# Patient Record
Sex: Male | Born: 1965 | Race: White | Hispanic: No | Marital: Single | Smoking: Never smoker
Health system: Southern US, Academic
[De-identification: ages and names within clinical notes are randomized; demographics above are authoritative.]

## PROBLEM LIST (undated history)

## (undated) ENCOUNTER — Inpatient Hospital Stay (HOSPITAL_COMMUNITY): Admission: EM | Payer: MEDICAID | Source: Other Acute Inpatient Hospital

## (undated) DIAGNOSIS — I10 Essential (primary) hypertension: Secondary | ICD-10-CM

## (undated) DIAGNOSIS — F1010 Alcohol abuse, uncomplicated: Secondary | ICD-10-CM

## (undated) DIAGNOSIS — H9190 Unspecified hearing loss, unspecified ear: Secondary | ICD-10-CM

## (undated) DIAGNOSIS — H9319 Tinnitus, unspecified ear: Secondary | ICD-10-CM

## (undated) HISTORY — DX: Unspecified hearing loss, unspecified ear: H91.90

## (undated) HISTORY — DX: Tinnitus, unspecified ear: H93.19

---

## 2018-06-30 ENCOUNTER — Encounter (HOSPITAL_COMMUNITY): Payer: Self-pay | Admitting: Emergency Medicine

## 2018-06-30 ENCOUNTER — Emergency Department (HOSPITAL_COMMUNITY): Payer: Self-pay

## 2018-06-30 ENCOUNTER — Emergency Department (HOSPITAL_COMMUNITY)
Admission: EM | Admit: 2018-06-30 | Discharge: 2018-06-30 | Disposition: A | Discharge status: Home / Self Care | Payer: Self-pay | Attending: Emergency Medicine | Admitting: Emergency Medicine

## 2018-06-30 ENCOUNTER — Other Ambulatory Visit: Payer: Self-pay

## 2018-06-30 DIAGNOSIS — S6992XA Unspecified injury of left wrist, hand and finger(s), initial encounter: Secondary | ICD-10-CM | POA: Insufficient documentation

## 2018-06-30 DIAGNOSIS — M25511 Pain in right shoulder: Secondary | ICD-10-CM | POA: Insufficient documentation

## 2018-06-30 DIAGNOSIS — Y9289 Other specified places as the place of occurrence of the external cause: Secondary | ICD-10-CM | POA: Insufficient documentation

## 2018-06-30 DIAGNOSIS — Y9389 Activity, other specified: Secondary | ICD-10-CM | POA: Insufficient documentation

## 2018-06-30 DIAGNOSIS — G8929 Other chronic pain: Secondary | ICD-10-CM | POA: Insufficient documentation

## 2018-06-30 DIAGNOSIS — W208XXA Other cause of strike by thrown, projected or falling object, initial encounter: Secondary | ICD-10-CM | POA: Insufficient documentation

## 2018-06-30 DIAGNOSIS — Y990 Civilian activity done for income or pay: Secondary | ICD-10-CM | POA: Insufficient documentation

## 2018-06-30 DIAGNOSIS — I10 Essential (primary) hypertension: Secondary | ICD-10-CM | POA: Insufficient documentation

## 2018-06-30 HISTORY — DX: Essential (primary) hypertension: I10

## 2018-06-30 MED ORDER — IBUPROFEN 200 MG PO TABS
600.0000 mg | ORAL_TABLET | Freq: Once | ORAL | Status: AC
  Administered 2018-06-30: 600 mg via ORAL
  Filled 2018-06-30: qty 3

## 2018-06-30 MED ORDER — IBUPROFEN 600 MG PO TABS: 600.0000 mg | ORAL_TABLET | Freq: Four times a day (QID) | ORAL | 0 refills | Status: AC | PRN

## 2018-06-30 NOTE — ED Notes (Signed)
Patient transported to X-ray 

## 2018-06-30 NOTE — ED Provider Notes (Signed)
[facility]-EMERGENCY DEPT Provider Note   CSN: [id number] Arrival date & time: 06/30/18  0532     History   Chief Complaint Chief Complaint  Patient presents with  . Wrist Pain    left  . Shoulder Pain    right    HPI Christopher Clayton is a 52 y.o. male w PMHx HTN, presenting to the ED with 2 days of acute onset of left wrist pain. 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 The box hit his left wrist and he has had diffuse pain to the left wrist, worse with movement. Reports assoc tingling sensation to fingers and swelling in hand, however denies numbness. No medications taken for pain. Pt also complaining of chronic right shoulder pain that is worse with lifting his arm. Reports remote injury after a fall at home and has had pain since. No medications taken for pain. Has not seen specialist for shoulder. No recent injury.   The history is provided by the patient.    Past Medical History:  Diagnosis Date  . Hypertension     There are no active problems to display for this patient.   History reviewed. No pertinent surgical history.      Home Medications    Prior to Admission medications   Not on File    Family History No family history on file.  Social History Social History   Tobacco Use  . Smoking status: Never Smoker  . Smokeless tobacco: Current User    Types: Chew  Substance Use Topics  . Alcohol use: Not Currently  . Drug use: Not on file     Allergies   Buspar [buspirone] and Dilantin [phenytoin sodium extended]   Review of Systems Review of Systems  Musculoskeletal: Positive for arthralgias and joint swelling.  Skin: Negative for color change and wound.  Neurological: Negative for numbness.     Physical Exam Updated Vital Signs BP 108/74   Pulse 79   Temp 97.7 F (36.5 C)   Resp 17   Ht 6' (1.829 m)   Wt 88.5 kg   SpO2 99%   BMI 26.45 kg/m   Physical Exam  Constitutional: He appears  well-developed and well-nourished. No distress.  HENT:  Head: Normocephalic and atraumatic.  Eyes: Conjunctivae are normal.  Cardiovascular: Normal rate.  Pulmonary/Chest: Effort normal.  Musculoskeletal:  Left wrist with generalized tenderness. No obvious swelling compared to right. No deformity, wounds, or ecchymosis. Pain with passive inversion. Normal sensation. Negative tinel's sign.  Right shoulder w TTP to anterior aspect. Pain with RROM with internal rotation. Pain with passive abduction. No deformity. Normal distal sensation.  Psychiatric: He has a normal mood and affect. His behavior is normal.  Nursing note and vitals reviewed.    ED Treatments / Results  Labs (all labs ordered are listed, but only abnormal results are displayed) Labs Reviewed - No data to display  EKG None  Radiology Dg Wrist Complete Left  Result Date: 06/30/2018 CLINICAL DATA:  New onset left wrist pain and tingling for 2 days, atraumatic EXAM: LEFT WRIST - COMPLETE 3+ VIEW COMPARISON:  None. FINDINGS: There is no evidence of fracture or dislocation. Degenerative spurring and MCP joints, especially 1 through 3. Diffuse arterial calcification. IMPRESSION: No acute finding. Electronically Signed   By: Marnee Spring M.D.   On: 06/30/2018 07:08    Procedures Procedures (including critical care time)  Medications Ordered in ED Medications - No data to display  Initial Impression / Assessment and Plan / ED Course  I have reviewed the triage vital signs and the nursing notes.  Pertinent labs & imaging results that were available during my care of the patient were reviewed by me and considered in my medical decision making (see chart for details).    Patient presenting with left wrist pain after a box fell on it 2 days ago.  Pain with range of motion and tingling sensation in the digits.  Negative Tinel sign.  X-ray showing degenerative changes though no acute injury.  Patient also with chronic right  shoulder pain after remote injury, suspect possible old rotator cuff injury.  Will provide sling for comfort.  Wrist splint.  Orthopedic referral for follow-up.  Discussed RICE therapy and NSAIDs.  Safe for discharge.  Discussed results, findings, treatment and follow up. Patient advised of return precautions. Patient verbalized understanding and agreed with plan.  Final Clinical Impressions(s) / ED Diagnoses   Final diagnoses:  Left wrist injury, initial encounter  Chronic right shoulder pain    ED Discharge Orders    None       Seanpaul Preece, [place] N, PA-C 06/30/18 1610    Nira Conn, MD 06/30/18 [phone]

## 2018-06-30 NOTE — Discharge Instructions (Signed)
Please read instructions below. Apply ice to your areas of pain for 20 minutes at a time. You can take ibuprofen every 6 hours as needed for pain and swelling. Schedule an appointment with the orthopedic specialist for further evaluation of your ongoing shoulder pain. Return to the ER for new or concerning symptoms.

## 2018-07-16 ENCOUNTER — Emergency Department (HOSPITAL_BASED_OUTPATIENT_CLINIC_OR_DEPARTMENT_OTHER)
Admission: EM | Admit: 2018-07-16 | Discharge: 2018-07-17 | Disposition: A | Discharge status: Home / Self Care | Payer: Self-pay | Attending: Emergency Medicine | Admitting: Emergency Medicine

## 2018-07-16 ENCOUNTER — Other Ambulatory Visit: Payer: Self-pay

## 2018-07-16 ENCOUNTER — Encounter (HOSPITAL_BASED_OUTPATIENT_CLINIC_OR_DEPARTMENT_OTHER): Payer: Self-pay

## 2018-07-16 DIAGNOSIS — F17220 Nicotine dependence, chewing tobacco, uncomplicated: Secondary | ICD-10-CM | POA: Insufficient documentation

## 2018-07-16 DIAGNOSIS — F1010 Alcohol abuse, uncomplicated: Secondary | ICD-10-CM

## 2018-07-16 DIAGNOSIS — Y900 Blood alcohol level of less than 20 mg/100 ml: Secondary | ICD-10-CM | POA: Insufficient documentation

## 2018-07-16 DIAGNOSIS — F1094 Alcohol use, unspecified with alcohol-induced mood disorder: Secondary | ICD-10-CM | POA: Insufficient documentation

## 2018-07-16 DIAGNOSIS — Z79899 Other long term (current) drug therapy: Secondary | ICD-10-CM | POA: Insufficient documentation

## 2018-07-16 DIAGNOSIS — R45851 Suicidal ideations: Secondary | ICD-10-CM | POA: Insufficient documentation

## 2018-07-16 HISTORY — DX: Alcohol abuse, uncomplicated: F10.10

## 2018-07-16 LAB — COMPREHENSIVE METABOLIC PANEL
ALT: 17 U/L (ref 0–44)
ANION GAP: 13 (ref 5–15)
AST: 16 U/L (ref 15–41)
Albumin: 4.2 g/dL (ref 3.5–5.0)
Alkaline Phosphatase: 53 U/L (ref 38–126)
BUN: 11 mg/dL (ref 6–20)
CHLORIDE: 94 mmol/L — AB (ref 98–111)
CO2: 25 mmol/L (ref 22–32)
CREATININE: 0.81 mg/dL (ref 0.61–1.24)
Calcium: 8.9 mg/dL (ref 8.9–10.3)
Glucose, Bld: 98 mg/dL (ref 70–99)
POTASSIUM: 3.8 mmol/L (ref 3.5–5.1)
SODIUM: 132 mmol/L — AB (ref 135–145)
Total Bilirubin: 0.5 mg/dL (ref 0.3–1.2)
Total Protein: 7.4 g/dL (ref 6.5–8.1)

## 2018-07-16 LAB — CBC WITH DIFFERENTIAL/PLATELET
Abs Immature Granulocytes: 0.04 10*3/uL (ref 0.00–0.07)
BASOS ABS: 0.1 10*3/uL (ref 0.0–0.1)
Basophils Relative: 1 %
EOS PCT: 4 %
Eosinophils Absolute: 0.4 10*3/uL (ref 0.0–0.5)
HEMATOCRIT: 37.7 % — AB (ref 39.0–52.0)
Hemoglobin: 12.6 g/dL — ABNORMAL LOW (ref 13.0–17.0)
IMMATURE GRANULOCYTES: 0 %
LYMPHS ABS: 2.8 10*3/uL (ref 0.7–4.0)
LYMPHS PCT: 28 %
MCH: 30.1 pg (ref 26.0–34.0)
MCHC: 33.4 g/dL (ref 30.0–36.0)
MCV: 90 fL (ref 80.0–100.0)
MONOS PCT: 6 %
Monocytes Absolute: 0.6 10*3/uL (ref 0.1–1.0)
NRBC: 0 % (ref 0.0–0.2)
Neutro Abs: 6.1 10*3/uL (ref 1.7–7.7)
Neutrophils Relative %: 61 %
Platelets: 202 10*3/uL (ref 150–400)
RBC: 4.19 MIL/uL — ABNORMAL LOW (ref 4.22–5.81)
RDW: 13 % (ref 11.5–15.5)
WBC: 10 10*3/uL (ref 4.0–10.5)

## 2018-07-16 LAB — RAPID URINE DRUG SCREEN, HOSP PERFORMED
AMPHETAMINES: NOT DETECTED
BENZODIAZEPINES: NOT DETECTED
Barbiturates: NOT DETECTED
COCAINE: NOT DETECTED
OPIATES: NOT DETECTED
TETRAHYDROCANNABINOL: NOT DETECTED

## 2018-07-16 LAB — ACETAMINOPHEN LEVEL

## 2018-07-16 LAB — SALICYLATE LEVEL: Salicylate Lvl: <7 mg/dL (ref 2.8–30.0)

## 2018-07-16 LAB — ETHANOL: ALCOHOL ETHYL (B): 173 mg/dL — AB (ref <10)

## 2018-07-16 MED ORDER — FLUTICASONE PROPIONATE 50 MCG/ACT NA SUSP
2.0000 | Freq: Every day | NASAL | Status: DC
  Administered 2018-07-17: 2 via NASAL
  Filled 2018-07-16: qty 16

## 2018-07-16 MED ORDER — THIAMINE HCL 100 MG/ML IJ SOLN: 100.0000 mg | Freq: Every day | INTRAMUSCULAR | Status: DC

## 2018-07-16 MED ORDER — LORAZEPAM 1 MG PO TABS: 0.0000 mg | ORAL_TABLET | Freq: Two times a day (BID) | ORAL | Status: DC

## 2018-07-16 MED ORDER — LORAZEPAM 2 MG/ML IJ SOLN: 0.0000 mg | Freq: Four times a day (QID) | INTRAMUSCULAR | Status: DC

## 2018-07-16 MED ORDER — LORAZEPAM 2 MG/ML IJ SOLN: 0.0000 mg | Freq: Two times a day (BID) | INTRAMUSCULAR | Status: DC

## 2018-07-16 MED ORDER — RIFAXIMIN 200 MG PO TABS
600.0000 mg | ORAL_TABLET | Freq: Two times a day (BID) | ORAL | Status: DC
  Administered 2018-07-17: 600 mg via ORAL
  Filled 2018-07-16: qty 3

## 2018-07-16 MED ORDER — TRAZODONE HCL 50 MG PO TABS: 200.0000 mg | ORAL_TABLET | Freq: Every day | ORAL | Status: DC

## 2018-07-16 MED ORDER — MELATONIN 5 MG PO TABS
10.0000 mg | ORAL_TABLET | Freq: Every day | ORAL | Status: DC
  Filled 2018-07-16: qty 2

## 2018-07-16 MED ORDER — LORAZEPAM 1 MG PO TABS: 0.0000 mg | ORAL_TABLET | Freq: Four times a day (QID) | ORAL | Status: DC

## 2018-07-16 MED ORDER — ATORVASTATIN CALCIUM 40 MG PO TABS
40.0000 mg | ORAL_TABLET | Freq: Every day | ORAL | Status: DC
  Administered 2018-07-17: 40 mg via ORAL
  Filled 2018-07-16: qty 1

## 2018-07-16 MED ORDER — LORATADINE 10 MG PO TABS
10.0000 mg | ORAL_TABLET | Freq: Every day | ORAL | Status: DC
  Administered 2018-07-17: 10 mg via ORAL
  Filled 2018-07-16: qty 1

## 2018-07-16 MED ORDER — DIVALPROEX SODIUM 500 MG PO DR TAB
500.0000 mg | DELAYED_RELEASE_TABLET | Freq: Two times a day (BID) | ORAL | Status: DC
  Administered 2018-07-17: 500 mg via ORAL
  Filled 2018-07-16: qty 1

## 2018-07-16 MED ORDER — VITAMIN B-1 100 MG PO TABS
100.0000 mg | ORAL_TABLET | Freq: Every day | ORAL | Status: DC
  Administered 2018-07-17: 100 mg via ORAL
  Filled 2018-07-16: qty 1

## 2018-07-16 NOTE — ED Triage Notes (Addendum)
Pt states he is here for SI and ETOH abuse-last ETOH "10 beers 30 miniutes ago"-states he was brought in "by the police"-states he is homeless -NAD-steady gait

## 2018-07-16 NOTE — ED Provider Notes (Signed)
[facility] EMERGENCY DEPARTMENT Provider Note   CSN: [id number] Arrival date & time: 07/16/18  1559     History   Chief Complaint Chief Complaint  Patient presents with  . Suicidal  . Alcohol Problem    HPI Christopher Clayton is a 52 y.o. male with a history of alcohol use disorder and hypertension presents to the emergency department with a chief complaint of suicidal ideation.  The patient reports he drank a 10 pack of 12 ounce beers earlier today.  Last drink at 1600.  He states that he planned to kill himself by drinking too much alcohol.  He reports he has been in previous alcohol treatment centers, but feels he would best benefit from being placed in a 2-year alcohol treatment facility.  He denies HI, auditory, or visual hallucinations.   He reports that prior to arrival he was out in the woods drinking alcohol when he became concerned that he saw a wild animal so he started walking and states that he went to a dental clinic down the street who insisted on getting him some help and provided him with resources.  The patient has no other complaints at this time including nausea, vomiting, seizure-like activity, headache, tremor, chest pain, dyspnea, weakness, numbness, or confusion.   He reports a history of previous complicated withdrawal from alcohol.  He reports a history of seizures, but is unable to state if they are from alcohol or from other etiologies.  He also states that he thinks that he might of had DTs previously.  [place] and recently came to the area for rehab for alcohol.  He reports he is previously gone for 2 years without drinking without complications.  He states that he does not have any chronic medical conditions, but takes several medications, but he is unsure what they are.  He denies IV recreational drug use and is a never smoker.   The history is provided by the patient. No language interpreter was used.    Past Medical History:    Diagnosis Date  . ETOH abuse   . Hypertension     There are no active problems to display for this patient.   History reviewed. No pertinent surgical history.      Home Medications    Prior to Admission medications   Medication Sig Start Date End Date Taking? Authorizing Provider  atorvastatin (LIPITOR) 40 MG tablet Take 40 mg by mouth daily.   Yes [provider]  cetirizine (ZYRTEC) 10 MG tablet Take 10 mg by mouth daily.   Yes [provider]  divalproex (DEPAKOTE) 500 MG DR tablet Take 500 mg by mouth 2 (two) times daily.   Yes [provider]  fluticasone (FLONASE) 50 MCG/ACT nasal spray Place 2 sprays into both nostrils daily.   Yes [provider]  hydrOXYzine (ATARAX/VISTARIL) 25 MG tablet Take 25 mg by mouth 3 (three) times daily as needed for anxiety.   Yes [provider]  meclizine (ANTIVERT) 25 MG tablet Take 25 mg by mouth 3 (three) times daily as needed for dizziness.   Yes [provider]  MELATONIN PO Take 10 mg by mouth at bedtime.   Yes [provider]  rifaximin (XIFAXAN) 200 MG tablet Take 600 mg by mouth 2 (two) times daily. Take for 14 days; prescribed on 06/18/18   Yes [provider]  risperiDONE (RISPERDAL) 2 MG tablet Take 2 mg by mouth at bedtime.   Yes [provider]  traZODone (DESYREL) 100 MG tablet Take 200 mg by mouth at bedtime.   Yes [provider]  ibuprofen (ADVIL,MOTRIN) 600 MG tablet Take 1 tablet (600 mg total) by mouth every 6 (six) hours as needed. 06/30/18   Robinson, [place] N, PA-C    Family History History reviewed. No pertinent family history.  Social History Social History   Tobacco Use  . Smoking status: Never Smoker  . Smokeless tobacco: Current User    Types: Chew  Substance Use Topics  . Alcohol use: Yes    Comment: hx of 8-10 forties daily  . Drug use: Never     Allergies   Buspar [buspirone] and Dilantin [phenytoin sodium  extended]   Review of Systems Review of Systems  Constitutional: Negative for appetite change and fever.  Respiratory: Negative for shortness of breath.   Cardiovascular: Negative for chest pain.  Gastrointestinal: Negative for abdominal pain, nausea and vomiting.  Genitourinary: Negative for dysuria.  Musculoskeletal: Negative for back pain.  Skin: Negative for rash.  Allergic/Immunologic: Negative for immunocompromised state.  Neurological: Negative for tremors, seizures, weakness and headaches.  Psychiatric/Behavioral: Positive for suicidal ideas. Negative for confusion, hallucinations and self-injury. The patient is not nervous/anxious.      Physical Exam Updated Vital Signs BP 104/67 (BP Location: Right Arm)   Pulse 77   Temp 97.7 F (36.5 C) (Oral)   Resp 20   SpO2 99%   Physical Exam  Constitutional: He appears well-developed.  HENT:  Head: Normocephalic.  Eyes: Pupils are equal, round, and reactive to light. Conjunctivae and EOM are normal. No scleral icterus.  Neck: Neck supple.  Cardiovascular: Normal rate, regular rhythm, normal heart sounds and intact distal pulses. Exam reveals no gallop and no friction rub.  No murmur heard. Pulmonary/Chest: Effort normal. No stridor. No respiratory distress. He has no wheezes. He has no rales. He exhibits no tenderness.  Abdominal: Soft. He exhibits no distension and no mass. There is no tenderness. There is no rebound and no guarding. No hernia.  Musculoskeletal: He exhibits no tenderness.  No tremor noted.   Neurological: He is alert.  No slurred speech.  Speaks in complete, fluent sentences.  Skin: Skin is warm and dry.  Psychiatric: He has a normal mood and affect. His speech is normal and behavior is normal. He is not actively hallucinating. Thought content is not paranoid and not delusional. Cognition and memory are normal. He expresses suicidal ideation. He expresses no homicidal ideation. He expresses no homicidal  plans.  Nursing note and vitals reviewed.  ED Treatments / Results  Labs (all labs ordered are listed, but only abnormal results are displayed) Labs Reviewed  COMPREHENSIVE METABOLIC PANEL - Abnormal; Notable for the following components:      Result Value   Sodium 132 (*)    Chloride 94 (*)    All other components within normal limits  ETHANOL - Abnormal; Notable for the following components:   Alcohol, Ethyl (B) 173 (*)    All other components within normal limits  CBC WITH DIFFERENTIAL/PLATELET - Abnormal; Notable for the following components:   RBC 4.19 (*)    Hemoglobin 12.6 (*)    HCT 37.7 (*)    All other components within normal limits  ACETAMINOPHEN LEVEL - Abnormal; Notable for the following components:   Acetaminophen (Tylenol), Serum <10 (*)    All other components within normal limits  RAPID URINE DRUG SCREEN, HOSP PERFORMED  SALICYLATE LEVEL    EKG None  Radiology No  results found.  Procedures Procedures (including critical care time)  Medications Ordered in ED Medications  LORazepam (ATIVAN) injection 0-4 mg (has no administration in time range)    Or  LORazepam (ATIVAN) tablet 0-4 mg (has no administration in time range)  LORazepam (ATIVAN) injection 0-4 mg (has no administration in time range)    Or  LORazepam (ATIVAN) tablet 0-4 mg (has no administration in time range)  thiamine (VITAMIN B-1) tablet 100 mg (has no administration in time range)    Or  thiamine (B-1) injection 100 mg (has no administration in time range)  atorvastatin (LIPITOR) tablet 40 mg (has no administration in time range)  loratadine (CLARITIN) tablet 10 mg (has no administration in time range)  divalproex (DEPAKOTE) DR tablet 500 mg (has no administration in time range)  fluticasone (FLONASE) 50 MCG/ACT nasal spray 2 spray (has no administration in time range)  Melatonin TABS 10 mg (has no administration in time range)  traZODone (DESYREL) tablet 200 mg (has no administration  in time range)  rifaximin (XIFAXAN) tablet 600 mg (has no administration in time range)     Initial Impression / Assessment and Plan / ED Course  I have reviewed the triage vital signs and the nursing notes.  Pertinent labs & imaging results that were available during my care of the patient were reviewed by me and considered in my medical decision making (see chart for details).     52 year old male a history of alcohol use disorder and hypertension presenting with suicidal ideation.  He states that he wants to kill himself by drinking too much alcohol.  No HI or auditory visual hallucinations.  On exam, the patient appears clinically sober with no evidence of withdrawal or acute intoxication.  The patient was discussed with Dr. Fredderick Phenix, attending physician.  Labs are notable for ethanol level of 173, mild hyponatremia of 132, and chloride of 94.  I suspect metabolic abnormalities are secondary to chronic alcohol use.  CIWA protocol has been placed.  Spoke with [employer], counselor with TTS, who reports the daytime provider recommended overnight observation for the patient given questionable history of complicated withdrawal from EtOH.  No records available other than one ED visit on 06/30/2018 in the patient's chart.  Recommended speaking with the night provider after labs were available to see if disposition could be changed.  Spoke with Karleen Hampshire, nighttime [facility] health provider, who recommended overnight observation in a.m. psych evaluation. Pt medically cleared at this time. Psych hold orders and home med orders placed. TTS consult pending; please see psych team notes for further documentation of care/dispo. Pt stable at time of med clearance.    Final Clinical Impressions(s) / ED Diagnoses   Final diagnoses:  None    ED Discharge Orders    None       Arnice Vanepps A, PA-C 07/17/18 0048    Rolan Bucco, MD 07/19/18 1234

## 2018-07-16 NOTE — BH Assessment (Signed)
Tele Assessment Note   Patient Name: Christopher Clayton MRN: [id number] Referring Physician: Fredderick Phenix Location of Patient: [facility] Location of Provider: Behavioral Health TTS Department  Patient presented at [facility] stating that he was depressed and suicidal with a plan to overdose.  Patient just left [facility] against medical advice today and drank ten beers at discharge. Patient states that he did not want to stay in the [facility] because they were having him to talk about his past and things that were painful to him.   Patient is new to this area and was in a program, [facility], in [place] for six months, but left because he said they were going to kick him out because he was not paying his fees to stay there after he completed the 90 day program. Prior to that he states that he was in another long term program in the [place] area. Patient is currently homeless.  Patient states that he is depressed because he has little support, his parents are deceased and he states that he has not seen his daughter in since she was born.  Patient states that he thought about overdosing on his prescription medications. However, it is highly unlikely that he was discharged from any facility against medical advice with any prescriptions or prescription medications.  Attempted to find out more about his prior treatment history and asked patient if he had ever been on any other behavioral health units and he states that 1-2 months ago that he was [facility].  When asked if he had been on any other units, he stated, "I am sure I have been." Patient denies HI/Psychosis.  Patient states that he has been drinking since he was [age].  He states that prior to going to [facility] that he was drinking 8-10 forty ounce beers daily.  He denies any other drug use and denies any current withdrawal symptoms. Patient denies any current legal involvement.  Patient presented as alert and oriented.  His recent memory was good, but  remote was poor.  His thoughts were organized.  He had problems hearing and answering questions because he states that he hear a noise in his ears all the time.  He maintained good eye contact.  His judgment, insight and impulse control were impaired.  He presented and clean and neat, he did not appear to be depressed despite his claim to be depressed.  However, he stated that he is experiencing sleep disturbance and loss of appetite with weight loss.    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 ETOH abuse   . Hypertension     History reviewed. No pertinent surgical history.  Family History: History reviewed. No pertinent family history.  Social History:  reports that he has never smoked. His smokeless tobacco use includes chew. He reports that he drinks alcohol. He reports that he does not use drugs.  Additional Social History:  Alcohol / Drug Use Pain Medications: see MAR Prescriptions: see MAR Over the Counter: see MAR History of alcohol / drug use?: Yes Longest period of sobriety (when/how long): no significant period of abstinece noted Negative Consequences of Use: Personal relationships, Work / Programmer, multimedia, Surveyor, quantity Substance #1 Name of Substance 1: alcohol 1 - Age of First Use: 11 1 - Amount (size/oz): 8-10 forties 1 - Frequency: daily 1 - Duration: since onset 1 - Last Use / Amount: last use was today, 10 beers  CIWA: CIWA-Ar BP: 104/67 Pulse Rate: 77 COWS:    Allergies:  Allergies  Allergen Reactions  . Buspar [Buspirone]   . Dilantin [Phenytoin Sodium Extended]     Home Medications:  (Not in a hospital admission)  OB/GYN Status:  No LMP for male patient.  General Assessment Data Location of Assessment: [facility] TTS Assessment: In system Is this a Tele or Face-to-Face Assessment?: Tele Assessment Is this an Initial Assessment or a  Re-assessment for this encounter?: Initial Assessment Patient Accompanied by:: N/A Language Other than English: No Living Arrangements: Homeless/Shelter What gender do you identify as?: Male Marital status: Divorced Living Arrangements: Alone Can pt return to current living arrangement?: Yes Admission Status: Voluntary Is patient capable of signing voluntary admission?: Yes Referral Source: Self/Family/Friend Insurance type: (self-pay)     Crisis Care Plan Living Arrangements: Alone Legal Guardian: Other:(self) Name of Psychiatrist: (none) Name of Therapist: (none)  Education Status Is patient currently in school?: No Is the patient employed, unemployed or receiving disability?: Unemployed  Risk to self with the past 6 months Suicidal Ideation: Yes-Currently Present Has patient been a risk to self within the past 6 months prior to admission? : No Suicidal Intent: No Has patient had any suicidal intent within the past 6 months prior to admission? : No Is patient at risk for suicide?: Yes Suicidal Plan?: Yes-Currently Present Has patient had any suicidal plan within the past 6 months prior to admission? : Yes(overdose on Rx pills) Specify Current Suicidal Plan: (overdose) Access to Means: No(no prescriptions reported in chart) What has been your use of drugs/alcohol within the last 12 months?: daily use Previous Attempts/Gestures: No How many times?: (none reported, patient is guarded) Other Self Harm Risks: (homeless, unemployed and no support) Triggers for Past Attempts: None known Intentional Self Injurious Behavior: None Family Suicide History: Unable to assess Recent stressful life event(s): Other (Comment)(states that he has never seen his 54 year old daughter) Persecutory voices/beliefs?: No Depression: Yes Depression Symptoms: Insomnia, Isolating, Loss of interest in usual pleasures Substance abuse history and/or treatment for substance abuse?: Yes Suicide  prevention information given to non-admitted patients: Not applicable  Risk to Others within the past 6 months Homicidal Ideation: No Does patient have any lifetime risk of violence toward others beyond the six months prior to admission? : No Thoughts of Harm to Others: No Current Homicidal Intent: No Current Homicidal Plan: No Access to Homicidal Means: No Identified Victim: none History of harm to others?: No Assessment of Violence: None Noted Violent Behavior Description: none Does patient have access to weapons?: No Criminal Charges Pending?: No Does patient have a court date: No Is patient on probation?: No  Psychosis Hallucinations: None noted Delusions: None noted  Mental Status Report Appearance/Hygiene: Unremarkable Eye Contact: Good Motor Activity: Freedom of movement Speech: Unremarkable Level of Consciousness: Alert Mood: Depressed, Apathetic Affect: Appropriate to circumstance Anxiety Level: None Thought Processes: Coherent, Relevant Judgement: Impaired Orientation: Person, Place, Time, Situation Obsessive Compulsive Thoughts/Behaviors: None  Cognitive Functioning Concentration: Decreased Memory: Recent Intact, Remote Impaired Is patient IDD: No Insight: Poor Impulse Control: Poor Appetite: Poor Have you had any weight changes? : Loss Amount of the weight change? (lbs): (10) Sleep: Decreased Total Hours of Sleep: (states that he is not sleeping at all.) Vegetative Symptoms: None  ADLScreening [facility] Assessment Services) Patient's cognitive ability adequate to safely complete daily activities?: Yes Patient able to express need for assistance with ADLs?: Yes Independently performs ADLs?: Yes (appropriate for developmental age)  Prior Inpatient Therapy Prior Inpatient Therapy: Yes Prior Therapy Dates: Just left at [facility] today, Was at [facility] prior to that Prior Therapy Facilty/Provider(s): (multiple facilities in the past) Reason for Treatment:  (alcoholism)  Prior Outpatient Therapy Prior Outpatient Therapy: No Does patient have an ACCT team?: No Does patient have Intensive In-House Services?  : No Does patient have [facility] services? : No Does patient have P4CC services?: No  ADL Screening (condition at time of admission) Patient's cognitive ability adequate to safely complete daily activities?: Yes Is the patient deaf or have difficulty hearing?: No Does the patient have difficulty seeing, even when wearing glasses/contacts?: No Does the patient have difficulty concentrating, remembering, or making decisions?: No Patient able to express need for assistance with ADLs?: Yes Does the patient have difficulty dressing or bathing?: No Independently performs ADLs?: Yes (appropriate for developmental age) Does the patient have difficulty walking or climbing stairs?: No Weakness of Legs: None Weakness of Arms/Hands: None  Home Assistive Devices/Equipment Home Assistive Devices/Equipment: None  Therapy Consults (therapy consults require a physician order) PT Evaluation Needed: No OT Evalulation Needed: No SLP Evaluation Needed: No Abuse/Neglect Assessment (Assessment to be complete while patient is alone) Abuse/Neglect Assessment Can Be Completed: Yes Physical Abuse: Denies Verbal Abuse: Denies Sexual Abuse: Denies Exploitation of patient/patient's resources: Denies Self-Neglect: Denies Values / Beliefs Cultural Requests During Hospitalization: None Spiritual Requests During Hospitalization: None Consults Spiritual Care Consult Needed: No Social Work Consult Needed: No Merchant navy officer (For Healthcare) Does Patient Have a Medical Advance Directive?: No Nutrition Screen- [facility] Adult/WL/AP Has the patient recently lost weight without trying?: Yes, 2-13 lbs. Has the patient been eating poorly because of a decreased appetite?: Yes Malnutrition Screening Tool Score: 2        Disposition: Per Shuvon Rankin, NP, Patient  will need to be observed and monitored for safety and withdrawal potential tonight.  However, when patient is medically cleared, and labs returned, the Night-time Provider may need to review to see if another disposition is possible. Disposition Initial Assessment Completed for this Encounter: (Overnight OBS or evaluate when medically cleared) Patient referred to: (OBS)  This service was provided via telemedicine using a 2-way, interactive audio and Immunologist.  Names of all persons participating in this telemedicine service and their role in this encounter. Name:Maren Wiesen Role: TTS  Name: Finneus Kaneshiro Role: Patient  Name:  Role:   Name:  Role:     Daphene Calamity 07/16/2018 6:44 PM

## 2018-07-17 ENCOUNTER — Other Ambulatory Visit: Payer: Self-pay

## 2018-07-17 ENCOUNTER — Emergency Department (HOSPITAL_COMMUNITY)
Admission: EM | Admit: 2018-07-17 | Discharge: 2018-07-18 | Disposition: A | Discharge status: Home / Self Care | Payer: Self-pay | Attending: Emergency Medicine | Admitting: Emergency Medicine

## 2018-07-17 ENCOUNTER — Encounter (HOSPITAL_COMMUNITY): Payer: Self-pay

## 2018-07-17 ENCOUNTER — Encounter (HOSPITAL_BASED_OUTPATIENT_CLINIC_OR_DEPARTMENT_OTHER): Payer: Self-pay | Admitting: Registered Nurse

## 2018-07-17 DIAGNOSIS — F10920 Alcohol use, unspecified with intoxication, uncomplicated: Secondary | ICD-10-CM

## 2018-07-17 DIAGNOSIS — Y9389 Activity, other specified: Secondary | ICD-10-CM | POA: Insufficient documentation

## 2018-07-17 DIAGNOSIS — W1839XA Other fall on same level, initial encounter: Secondary | ICD-10-CM | POA: Insufficient documentation

## 2018-07-17 DIAGNOSIS — F10220 Alcohol dependence with intoxication, uncomplicated: Secondary | ICD-10-CM | POA: Insufficient documentation

## 2018-07-17 DIAGNOSIS — R45851 Suicidal ideations: Secondary | ICD-10-CM | POA: Insufficient documentation

## 2018-07-17 DIAGNOSIS — Y999 Unspecified external cause status: Secondary | ICD-10-CM | POA: Insufficient documentation

## 2018-07-17 DIAGNOSIS — Z79899 Other long term (current) drug therapy: Secondary | ICD-10-CM | POA: Insufficient documentation

## 2018-07-17 DIAGNOSIS — I10 Essential (primary) hypertension: Secondary | ICD-10-CM | POA: Insufficient documentation

## 2018-07-17 DIAGNOSIS — S0081XA Abrasion of other part of head, initial encounter: Secondary | ICD-10-CM | POA: Insufficient documentation

## 2018-07-17 DIAGNOSIS — Y92512 Supermarket, store or market as the place of occurrence of the external cause: Secondary | ICD-10-CM | POA: Insufficient documentation
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
## 2018-07-17 NOTE — ED Notes (Signed)
NAD at this time. Pt is stable and going home.  

## 2018-07-17 NOTE — Progress Notes (Signed)
Patient is seen by me via tele-psych and I have consulted with Dr. Lucianne MussKumar.  Patient denies any suicidal or homicidal ideations and denies any hallucinations.  Patient reports that he wants to get help with his alcohol abuse and reports that he is an alcoholic.  Patient walked out of [facility] yesterday after 16 days and stated that he left because of him being not as intelligent as they are and that he could not keep up with him.  He also reports that he was in a sober living house for 2 weeks prior to going to [facility].  Patient states that he wants an very long-term treatment, but then he refuses information for [facility], [facility], AA, NA, [facility], [facility] and states that he has a paper that was given to him by a lady that worked in the dentist office yesterday and that is where he wants to go.  However, patient could not tell me where this facility was.  Patient is informed that he does not meet inpatient criteria and he becomes upset when he is told that he will be discharged.  He he states to me that he will go out and buy more alcohol if he is discharged from the hospital.  Patient is informed and encouraged to follow his treatment coping skills and to contact crisis lines to help him remain from consuming alcohol.  Patient turns his head away from me and will not answer any further questions.  Patient ended up refusing every option that we offered him today.  I contacted Dr. Rubin PayorPickering and notified him of the recommendations.  Patient does not meet inpatient criteria and is psychiatrically cleared.
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
## 2018-07-17 NOTE — ED Notes (Signed)
Feliz Beamravis called, telepsych machine in room, ready for visit.

## 2018-07-17 NOTE — ED Notes (Signed)
Pt ambulatory to the bathroom w/o assistance and with steady gait 

## 2018-07-17 NOTE — ED Provider Notes (Signed)
  Physical Exam  BP 105/72 (BP Location: Left Arm)   Pulse 90   Temp 97.8 F (36.6 C) (Oral)   Resp 18   SpO2 98%   Physical Exam  ED Course/Procedures     Procedures  MDM  Seen by psychiatry again.  Cleared for discharge.  Psychiatry states that they offered many resources and he would want none of them.  Not suicidal.  States he was just drunk last night.  Discharge home.       Benjiman CorePickering, Tasheka Houseman, MD 07/17/18 [phone]

## 2018-07-17 NOTE — ED Notes (Signed)
This RN was assisting patient to bathroom when it was noticed that he had something in his yellow socks. I asked the patient politely what was in his sock and he stated it was his tobacco. I told him it was policy for me to put it with his belongings and that nothing could be left in the room with him. He asked if I needed to take it and I said yes and I offered him a nicotine patch. He handed over the chewing tobacco, but refused the nicotine patch. Security was called to wand the patient again to double check that nothing was still on his person.
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
## 2018-07-18 NOTE — ED Notes (Signed)
Patient refusing any care at this time. Explained to patient the importance of cleaning his abrasion for infection control and patient states "I'm not worried about it, don't touch me, have a nice night"

## 2018-07-18 NOTE — ED Notes (Signed)
Patient refused vital signs and became agitated when he was told that he was to be discharged. Patient informed that he could not stay in the room and sleep until he figured out where he was going to go. Patient began cursing and security was called. Patient escorted to lobby, did not sign discharge paperwork

## 2019-01-20 ENCOUNTER — Inpatient Hospital Stay (HOSPITAL_COMMUNITY)
Admission: EM | Admit: 2019-01-20 | Discharge: 2019-01-20 | Disposition: A | Discharge status: Home / Self Care | Payer: Self-pay | Source: Other Acute Inpatient Hospital

## 2019-01-20 ENCOUNTER — Emergency Department (HOSPITAL_COMMUNITY): Payer: Self-pay | Admitting: Emergency Medicine

## 2019-01-20 DIAGNOSIS — Y906 Blood alcohol level of 120-199 mg/100 ml: Secondary | ICD-10-CM

## 2019-01-20 DIAGNOSIS — F10129 Alcohol abuse with intoxication, unspecified: Secondary | ICD-10-CM

## 2019-01-20 DIAGNOSIS — R45851 Suicidal ideations: Secondary | ICD-10-CM

## 2019-01-20 DIAGNOSIS — F329 Major depressive disorder, single episode, unspecified: Secondary | ICD-10-CM

## 2019-02-04 ENCOUNTER — Inpatient Hospital Stay (HOSPITAL_COMMUNITY)
Admission: EM | Admit: 2019-02-04 | Discharge: 2019-02-04 | Disposition: A | Discharge status: Home / Self Care | Payer: Self-pay | Source: Other Acute Inpatient Hospital

## 2019-02-04 DIAGNOSIS — Y909 Presence of alcohol in blood, level not specified: Secondary | ICD-10-CM

## 2019-02-04 DIAGNOSIS — F10129 Alcohol abuse with intoxication, unspecified: Secondary | ICD-10-CM

## 2019-02-04 DIAGNOSIS — J189 Pneumonia, unspecified organism: Secondary | ICD-10-CM

## 2019-02-04 DIAGNOSIS — Z590 Homelessness: Secondary | ICD-10-CM

## 2019-02-17 ENCOUNTER — Inpatient Hospital Stay (HOSPITAL_COMMUNITY)
Admission: EM | Admit: 2019-02-17 | Discharge: 2019-02-17 | Disposition: A | Discharge status: Home / Self Care | Payer: Medicaid Other | Source: Other Acute Inpatient Hospital

## 2019-03-24 IMAGING — CR DG WRIST COMPLETE 3+V*L*
4 series · 4 of 4 positions shown · non-contrast
Comparison: None.

CLINICAL DATA: New onset left wrist pain and tingling for 2 days,
atraumatic

EXAM:
LEFT WRIST - COMPLETE 3+ VIEW

[x wrist pa left]
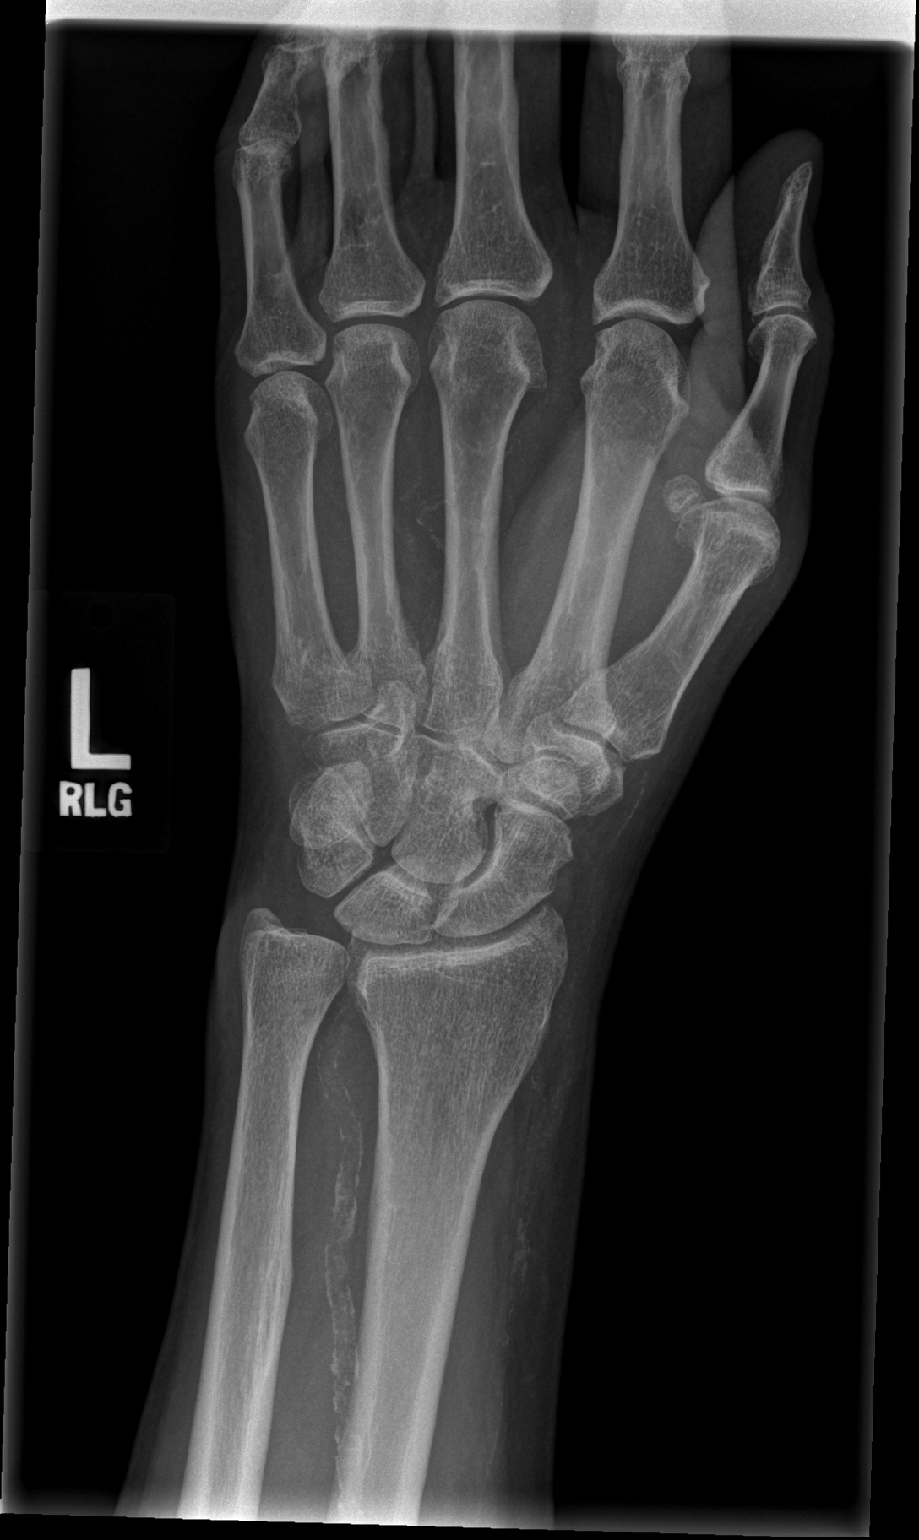

[x wrist obl left]
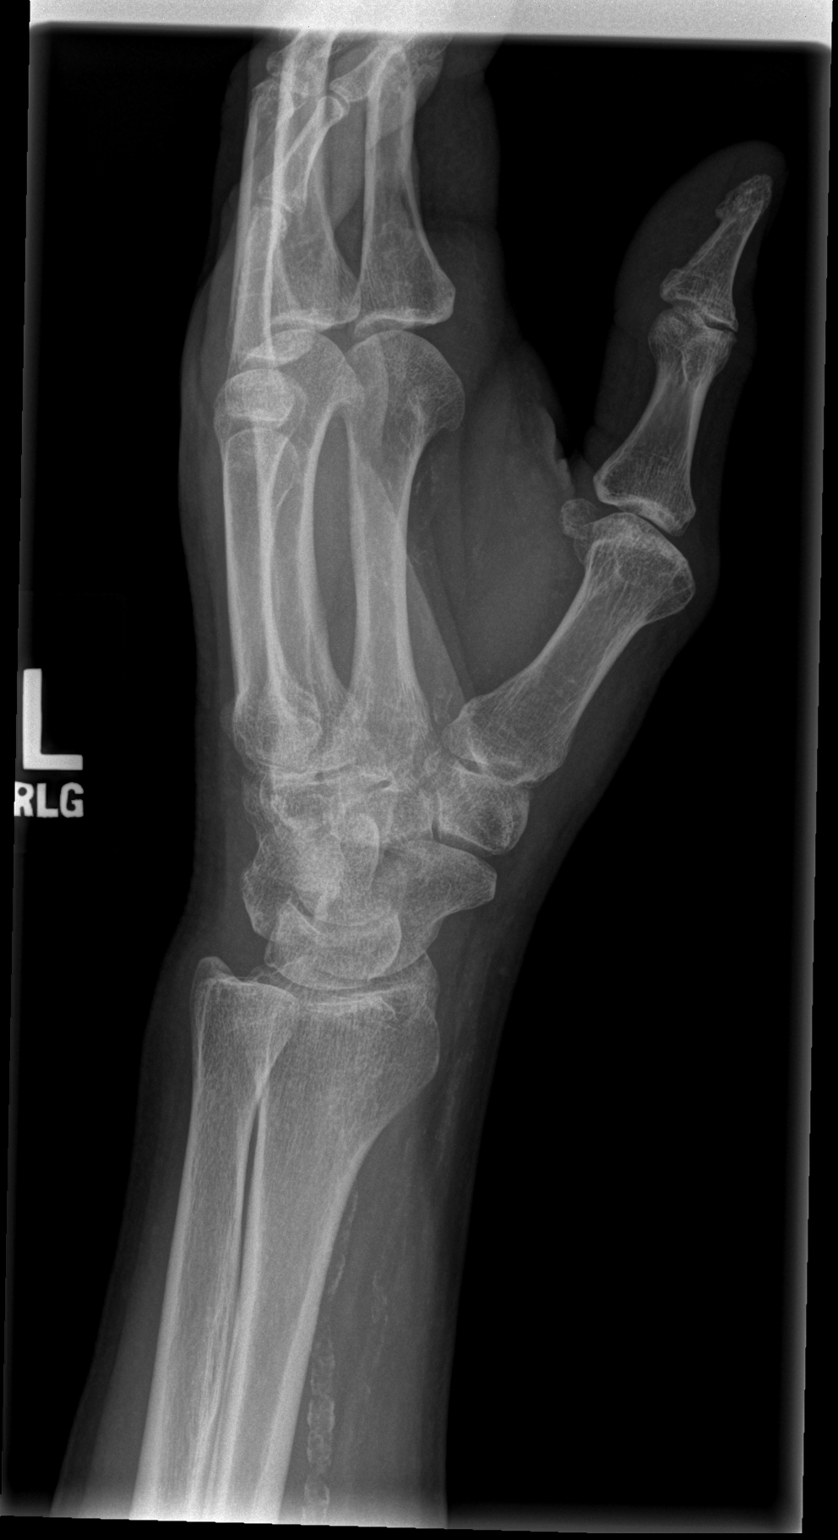

[x wrist lat left]
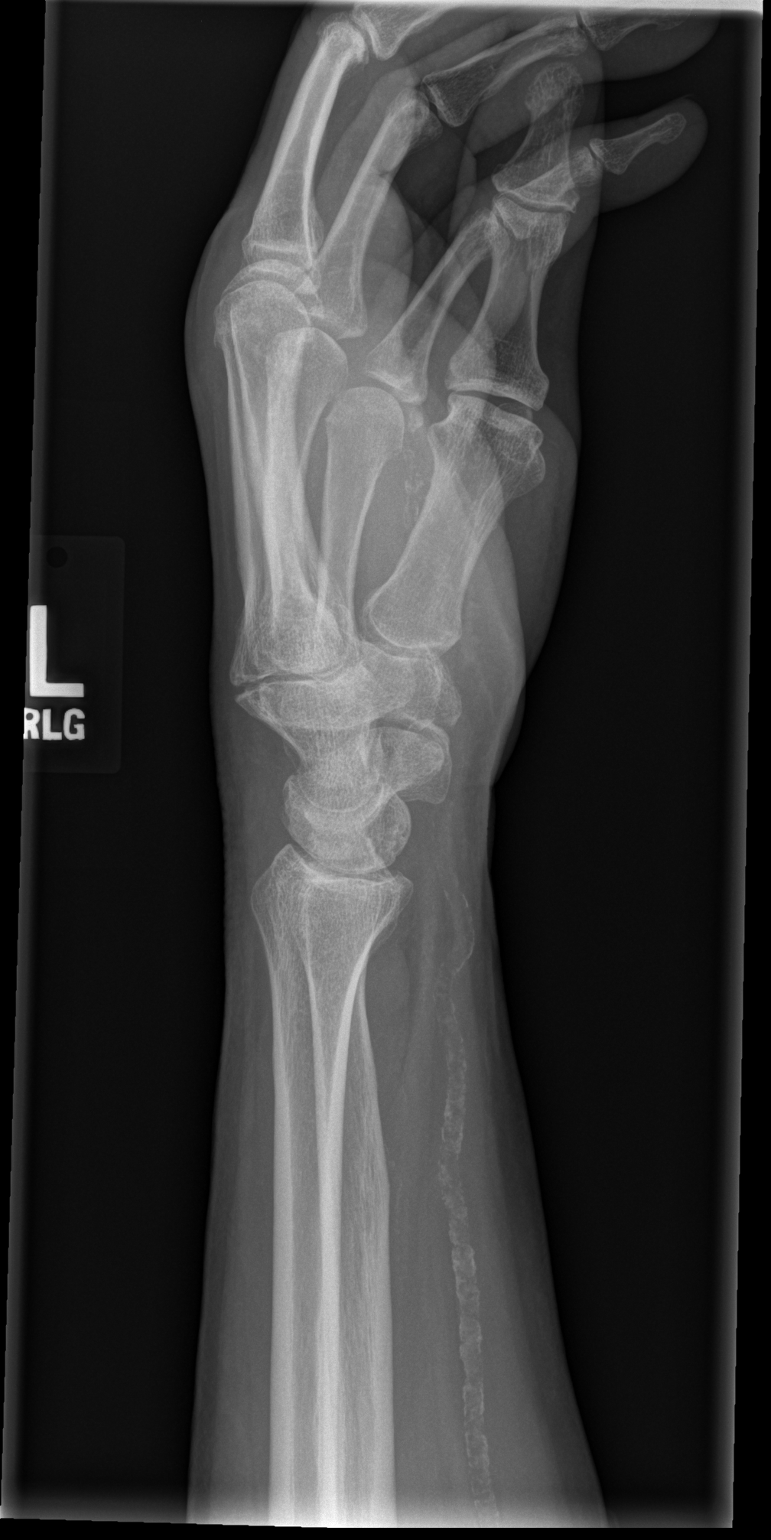

[x wrist navicular view left]
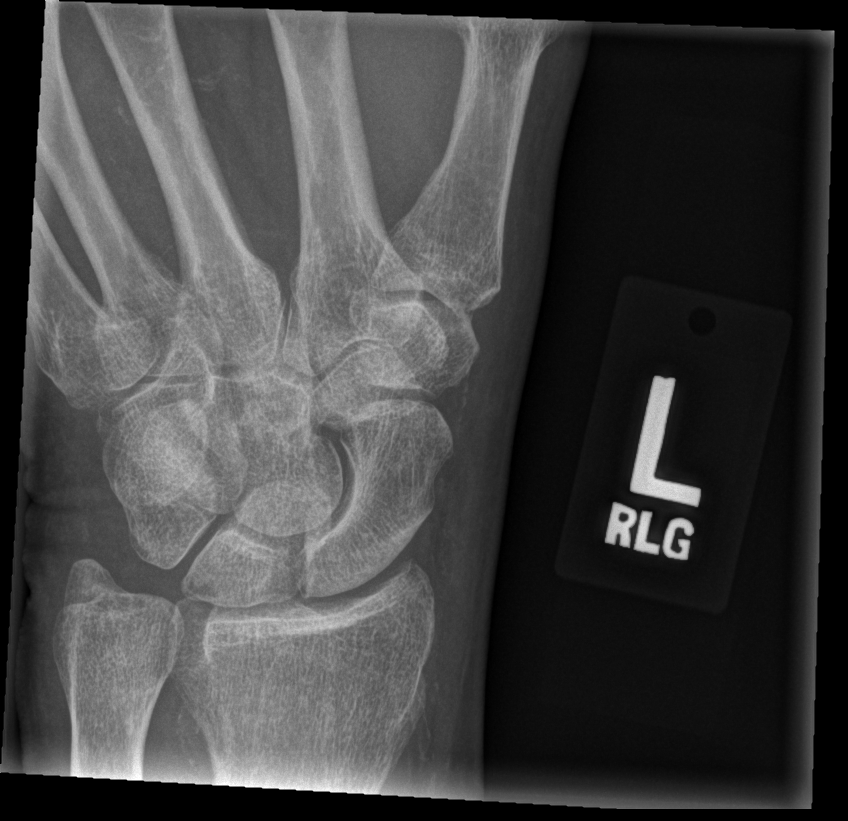

[4 of 4 positions shown; findings below may reference images not displayed]

FINDINGS: There is no evidence of fracture or dislocation. Degenerative
spurring and MCP joints, especially 1 through 3. Diffuse arterial
calcification.
IMPRESSION: No acute finding.

## 2019-04-27 ENCOUNTER — Ambulatory Visit (INDEPENDENT_AMBULATORY_CARE_PROVIDER_SITE_OTHER): Payer: Self-pay | Admitting: Otolaryngology

## 2019-05-07 ENCOUNTER — Ambulatory Visit (INDEPENDENT_AMBULATORY_CARE_PROVIDER_SITE_OTHER): Payer: Self-pay | Admitting: Otolaryngology

## 2019-05-11 ENCOUNTER — Ambulatory Visit (INDEPENDENT_AMBULATORY_CARE_PROVIDER_SITE_OTHER): Payer: Medicaid Other | Admitting: Otolaryngology

## 2019-05-11 ENCOUNTER — Encounter (INDEPENDENT_AMBULATORY_CARE_PROVIDER_SITE_OTHER): Payer: Self-pay | Admitting: Otolaryngology

## 2019-05-11 ENCOUNTER — Other Ambulatory Visit: Payer: Self-pay

## 2019-05-11 VITALS — BP 116/78 | HR 93 | Temp 98.8°F | Ht 73.0 in | Wt 226.1 lb

## 2019-05-11 DIAGNOSIS — H903 Sensorineural hearing loss, bilateral: Secondary | ICD-10-CM

## 2019-05-11 DIAGNOSIS — H6123 Impacted cerumen, bilateral: Secondary | ICD-10-CM

## 2019-05-11 DIAGNOSIS — H9319 Tinnitus, unspecified ear: Secondary | ICD-10-CM

## 2019-05-11 DIAGNOSIS — H9190 Unspecified hearing loss, unspecified ear: Secondary | ICD-10-CM

## 2019-05-11 DIAGNOSIS — F17200 Nicotine dependence, unspecified, uncomplicated: Secondary | ICD-10-CM

## 2019-05-11 DIAGNOSIS — H6980 Other specified disorders of Eustachian tube, unspecified ear: Secondary | ICD-10-CM

## 2019-05-11 DIAGNOSIS — H9313 Tinnitus, bilateral: Secondary | ICD-10-CM

## 2019-05-11 NOTE — Progress Notes (Signed)
[facility], [facility]  [address]  [place] [id number]  [phone]    PATIENT NAME:  Kevin Hodges  MRN:  [id number]  DOB:  04/18/1966  DATE OF SERVICE: 05/11/2019    Chief Complaint:  Ear Problem(s) (both ears swishing noise) and Dizziness      HPI:  Kevin Hodges is a 53 y.o. male presenting as a new patient for evaluation of tinnitus. Patient reports that he hears a swishing noise in his ears. He also states that he has trouble hearing when there is background noise. 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 He denies recent audiogram. He also reports slight vertigo. He denies significant hx of ear infections. He does note that his ears pop at times.    Past Medical History:  Past Medical History:   Diagnosis Date   . Hearing loss    . Tinnitus      Past Surgical History:  No past surgical history on file.    Family History:  Family Medical History:     Problem Relation (Age of Onset)    No Known Problems Mother, Father        Social History:  Social History     Tobacco Use   Smoking Status Never Smoker   Smokeless Tobacco Current User     Social History     Substance and Sexual Activity   Alcohol Use Yes   . Binge frequency: Monthly     Social History     Occupational History   . Not on file     Medications:  Outpatient Medications Marked as Taking for the 05/11/19 encounter (Office Visit) with Marcene Duosliverio, Evolette Pendell, MD   Medication Sig   . albuterol sulfate (PROVENTIL OR VENTOLIN OR PROAIR) 90 mcg/actuation Inhalation HFA Aerosol Inhaler    . divalproex (DEPAKOTE) 250 mg Oral Tablet, Delayed Release (E.C.)    . DULoxetine (CYMBALTA DR) 30 mg Oral Capsule, Delayed Release(E.C.)    . folic acid (FOLVITE) 1 mg Oral Tablet    . gabapentin (NEURONTIN) 300 mg Oral Capsule    . hydrOXYzine HCL (ATARAX) 50 mg Oral Tablet    . meclizine (ANTIVERT) 12.5 mg Oral Tablet    . omeprazole (PRILOSEC) 20 mg Oral Capsule, Delayed Release(E.C.)    .  ondansetron (ZOFRAN) 4 mg Oral Tablet    . pantoprazole (PROTONIX) 40 mg Oral Tablet, Delayed Release (E.C.)    . risperiDONE (RISPERDAL) 3 mg Oral Tablet    . traZODone (DESYREL) 100 mg Oral Tablet      Allergies:  Allergies   Allergen Reactions   . Dilantin [Phenytoin] Mental Status Effect   . Buspirone Swelling   . Ibuprofen Nausea/ Vomiting     Review of Systems:  Do you have any fevers: no   Any weight change: no   Change in your vision: yes    Chest Pain: no   Shortness of Breath: yes   Stomach pain: no   Urinary difficulity: no   Joint Pain: no   Skin Problems: yes   Weakness or Numbness: yes   Easy Bruising or Bleeding: no   Excessive Thirst: yes   Seasonal Allergies: yes    All other systems reviewed and found to be negative.    Physical Exam:  Blood pressure 116/78, pulse 93, temperature 37.1 C (98.8 F), height 1.854 m (6\' 1" ), weight 103 kg (226 lb 1.6 oz), SpO2 99 %.  Body mass index is 29.83 kg/m.  General Appearance: Pleasant, cooperative, healthy, and in no acute distress.  Eyes: Conjunctivae/corneas clear, PERRLA, EOM's intact.  Head and Face: Normocephalic, atraumatic.  Face symmetric, no obvious lesions.   Right Ear:       Pinnae: Normal shape and position.        External auditory canals:  Impacted cerumen, removed with curette and suction.       Tympanic membranes:  Intact, translucent, midposition, middle ear aerated.  Left Ear:       Pinnae: Normal shape and position.        External auditory canals:  Impacted cerumen, removed with curette and suction.       Tympanic membranes:  Intact, translucent, midposition, middle ear aerated.  Nose: External pyramid midline. Mucosa normal. No purulence, polyps, or crusts.   Oral Cavity/Oropharynx: No mucosal lesions, masses, or pharyngeal asymmetry.  Tonsils: Deferred.  Nasopharynx: Deferred.  Hypopharynx/Larynx: Deferred.  Neck: no cervical adenopathy, no palpable thyroid or salivary gland masses  Thyroid: no significant thyroid abnormality by  palpation  Cardiovascular: Good perfusion of upper extremities.  No cyanosis of the hands or fingers.  Lungs: No apparent stridorous breathing. No acute distress.  Skin: Skin warm and dry.  Neurologic: Cranial nerves:  grossly intact.  Psychiatric: Alert and oriented x 3.    Procedure:  Bilateral EAC(s) examined under binocular microscopy. Tympanic membranes occluded. Impacted cerumen was cleaned from the canal(s) using curette and #7 suction.  Patient tolerated procedure well.  Underlying anatomy was normal.     I am scribing for, and in the presence of, Dr. Whitney Post for services provided on 05/11/2019.  Gareth Morgan DeFazio, SCRIBE   Gareth Morgan DeFazio, SCRIBE, 05/11/2019, 10:50    I personally performed the services described in this documentation, as scribed  in my presence, and it is both accurate  and complete.    Concha Norway, MD      Data Reviewed:    Audiogram performed today (05/11/2019) in clinic, showing moderate to severe SNHL, AU.  Tympanogram: Type A, AU.    Assessment:  1. Tinnitus, bilateral.  2. 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  3. Cerumen impactions, bilateral, cleared today in clinic.    Plan:  Orders Placed This Encounter   . COMPREHENSIVE AUDIOGRAM (AMB)   . TYMPANOGRAM (PEDS - AMB ONLY)   . REMOVAL IMPACTED CERUMEN-PROVIDER PERFORMED (AMB ONLY)     1. Cerumen impactions cleared today in clinic.  2. Recommend hearing amplification. Hearing aid clearance provided.  3. Avoid high levels of noise exposure and wear hearing protection as appropriate to minimize hearing loss and tinnitus.  4. F/U as needed for reevaluation.    Concha Norway, MD  Department of Otolaryngology  [facility]    PCP: [facility]  [place] [address]   REF: [address], [facility]  [place] [address]       I am scribing for, and in the presence of, Dr. Concha Norway, for services provided on 05/11/2019.  Gareth Morgan DeFazio, SCRIBE   Gareth Morgan  DeFazio, SCRIBE, 05/11/2019, 09:58      I personally performed the services described in this documentation, as scribed  in my presence, and it is both accurate  and complete.    Concha Norway, MD

## 2019-05-11 NOTE — Procedures (Signed)
Bilateral EAC(s) examined under binocular microscopy. Tympanic membranes occluded. Impacted cerumen was cleaned from the canal(s) using curette and #7 suction.  Patient tolerated procedure well.  Underlying anatomy was normal.     I am scribing for, and in the presence of, Dr. Whitney Post for services provided on 05/11/2019.  Gareth Morgan DeFazio, SCRIBE   Gareth Morgan DeFazio, SCRIBE, 05/11/2019, 10:50    I personally performed the services described in this documentation, as scribed  in my presence, and it is both accurate  and complete.    Concha Norway, MD

## 2019-07-08 ENCOUNTER — Inpatient Hospital Stay (HOSPITAL_COMMUNITY)
Admission: EM | Admit: 2019-07-08 | Discharge: 2019-07-08 | Disposition: A | Discharge status: Home / Self Care | Payer: Medicaid Other | Source: Other Acute Inpatient Hospital

## 2019-07-08 DIAGNOSIS — M5441 Lumbago with sciatica, right side: Secondary | ICD-10-CM

## 2019-07-08 DIAGNOSIS — Z20828 Contact with and (suspected) exposure to other viral communicable diseases: Secondary | ICD-10-CM

## 2019-07-08 DIAGNOSIS — E876 Hypokalemia: Secondary | ICD-10-CM

## 2019-07-08 DIAGNOSIS — F1010 Alcohol abuse, uncomplicated: Secondary | ICD-10-CM

## 2019-07-11 ENCOUNTER — Inpatient Hospital Stay (HOSPITAL_COMMUNITY)
Admission: EM | Admit: 2019-07-11 | Discharge: 2019-07-11 | Disposition: A | Discharge status: Home / Self Care | Payer: Medicaid Other | Source: Other Acute Inpatient Hospital

## 2019-07-11 DIAGNOSIS — R202 Paresthesia of skin: Secondary | ICD-10-CM

## 2019-07-11 DIAGNOSIS — R0600 Dyspnea, unspecified: Secondary | ICD-10-CM

## 2019-07-11 DIAGNOSIS — F319 Bipolar disorder, unspecified: Secondary | ICD-10-CM

## 2019-07-11 DIAGNOSIS — F1010 Alcohol abuse, uncomplicated: Secondary | ICD-10-CM

## 2019-07-11 DIAGNOSIS — I499 Cardiac arrhythmia, unspecified: Secondary | ICD-10-CM

## 2019-07-11 DIAGNOSIS — R42 Dizziness and giddiness: Secondary | ICD-10-CM

## 2019-07-12 DIAGNOSIS — I499 Cardiac arrhythmia, unspecified: Secondary | ICD-10-CM

## 2019-07-12 DIAGNOSIS — R0602 Shortness of breath: Secondary | ICD-10-CM

## 2019-07-12 DIAGNOSIS — I517 Cardiomegaly: Secondary | ICD-10-CM

## 2019-07-14 DIAGNOSIS — I499 Cardiac arrhythmia, unspecified: Secondary | ICD-10-CM

## 2019-07-19 ENCOUNTER — Inpatient Hospital Stay (HOSPITAL_COMMUNITY)
Admission: EM | Admit: 2019-07-19 | Discharge: 2019-07-19 | Disposition: A | Discharge status: Home / Self Care | Payer: Medicaid Other | Source: Other Acute Inpatient Hospital

## 2019-07-19 DIAGNOSIS — K5900 Constipation, unspecified: Secondary | ICD-10-CM

## 2019-07-19 DIAGNOSIS — K602 Anal fissure, unspecified: Secondary | ICD-10-CM

## 2019-08-30 ENCOUNTER — Inpatient Hospital Stay (HOSPITAL_COMMUNITY)
Admission: EM | Admit: 2019-08-30 | Discharge: 2019-08-30 | Disposition: A | Discharge status: Home / Self Care | Payer: Medicaid Other | Source: Other Acute Inpatient Hospital

## 2019-08-30 DIAGNOSIS — R780 Finding of alcohol in blood: Secondary | ICD-10-CM

## 2019-08-30 DIAGNOSIS — Y904 Blood alcohol level of 80-99 mg/100 ml: Secondary | ICD-10-CM

## 2019-08-30 DIAGNOSIS — M545 Low back pain: Secondary | ICD-10-CM

## 2019-08-30 DIAGNOSIS — M79605 Pain in left leg: Secondary | ICD-10-CM

## 2019-09-08 ENCOUNTER — Inpatient Hospital Stay (HOSPITAL_COMMUNITY)
Admission: EM | Admit: 2019-09-08 | Discharge: 2019-09-08 | Disposition: A | Discharge status: Home / Self Care | Payer: Medicaid Other | Source: Other Acute Inpatient Hospital

## 2019-09-08 DIAGNOSIS — M545 Low back pain: Secondary | ICD-10-CM

## 2019-09-08 DIAGNOSIS — F1010 Alcohol abuse, uncomplicated: Secondary | ICD-10-CM

## 2020-02-02 ENCOUNTER — Emergency Department (HOSPITAL_COMMUNITY): Payer: Medicaid Other

## 2020-02-02 ENCOUNTER — Encounter (HOSPITAL_COMMUNITY): Payer: Self-pay

## 2020-02-02 ENCOUNTER — Emergency Department
Admission: EM | Admit: 2020-02-02 | Discharge: 2020-02-02 | Disposition: A | Discharge status: Home / Self Care | Payer: Medicaid Other | Attending: EMERGENCY MEDICINE | Admitting: EMERGENCY MEDICINE

## 2020-02-02 ENCOUNTER — Other Ambulatory Visit: Payer: Self-pay

## 2020-02-02 DIAGNOSIS — I440 Atrioventricular block, first degree: Secondary | ICD-10-CM

## 2020-02-02 DIAGNOSIS — R519 Headache, unspecified: Secondary | ICD-10-CM | POA: Insufficient documentation

## 2020-02-02 DIAGNOSIS — M5136 Other intervertebral disc degeneration, lumbar region: Secondary | ICD-10-CM | POA: Insufficient documentation

## 2020-02-02 DIAGNOSIS — R531 Weakness: Secondary | ICD-10-CM | POA: Insufficient documentation

## 2020-02-02 DIAGNOSIS — W1839XA Other fall on same level, initial encounter: Secondary | ICD-10-CM | POA: Insufficient documentation

## 2020-02-02 DIAGNOSIS — Z720 Tobacco use: Secondary | ICD-10-CM | POA: Insufficient documentation

## 2020-02-02 DIAGNOSIS — R296 Repeated falls: Secondary | ICD-10-CM | POA: Insufficient documentation

## 2020-02-02 DIAGNOSIS — Z886 Allergy status to analgesic agent status: Secondary | ICD-10-CM

## 2020-02-02 DIAGNOSIS — M47816 Spondylosis without myelopathy or radiculopathy, lumbar region: Secondary | ICD-10-CM | POA: Insufficient documentation

## 2020-02-02 DIAGNOSIS — J341 Cyst and mucocele of nose and nasal sinus: Secondary | ICD-10-CM | POA: Insufficient documentation

## 2020-02-02 DIAGNOSIS — M542 Cervicalgia: Secondary | ICD-10-CM | POA: Insufficient documentation

## 2020-02-02 DIAGNOSIS — M549 Dorsalgia, unspecified: Secondary | ICD-10-CM | POA: Insufficient documentation

## 2020-02-02 DIAGNOSIS — W19XXXA Unspecified fall, initial encounter: Secondary | ICD-10-CM

## 2020-02-02 LAB — BASIC METABOLIC PANEL
ANION GAP: 10 mmol/L (ref 6–15)
BUN: 8 mg/dL (ref 7–21)
CALCIUM: 8.4 mg/dL (ref 8.0–10.6)
CHLORIDE: 99 mmol/L (ref 98–107)
CO2 TOTAL: 25 mmol/L (ref 21–32)
CREATININE: 1.11 mg/dL (ref 0.80–1.60)
ESTIMATED GFR: >60 mL/min/{1.73_m2}
GLUCOSE: 104 mg/dL — ABNORMAL HIGH (ref 70–100)
POTASSIUM: 3.6 mmol/L (ref 3.3–5.1)
SODIUM: 134 mmol/L — ABNORMAL LOW (ref 136–146)

## 2020-02-02 LAB — RED TOP TUBE

## 2020-02-02 LAB — ECG 12 LEAD
Atrial Rate: 68 {beats}/min
Calculated P Axis: 35 degrees
Calculated R Axis: -43 degrees
Calculated T Axis: 62 degrees
PR Interval: 202 ms
QRS Duration: 90 ms
QT Interval: 444 ms
QTC Calculation: 472 ms
Ventricular rate: 68 {beats}/min

## 2020-02-02 LAB — CBC WITH DIFF
BASOPHIL #: <0.1 10*3/uL (ref <=0.20)
BASOPHIL %: 1 %
EOSINOPHIL #: 1.77 10*3/uL — ABNORMAL HIGH (ref <=0.50)
EOSINOPHIL %: 18 %
HCT: 36.3 % — ABNORMAL LOW (ref 38.9–52.0)
HGB: 12.3 g/dL — ABNORMAL LOW (ref 13.4–17.5)
IMMATURE GRANULOCYTE #: <0.1 10*3/uL (ref <0.10)
IMMATURE GRANULOCYTE %: 0 % (ref 0–1)
LYMPHOCYTE #: 3.26 10*3/uL (ref 1.00–4.80)
LYMPHOCYTE %: 33 %
MCH: 28.7 pg (ref 26.0–32.0)
MCHC: 33.9 g/dL (ref 31.0–35.5)
MCV: 84.8 fL (ref 78.0–100.0)
MONOCYTE #: 0.53 10*3/uL (ref 0.20–1.10)
MONOCYTE %: 5 %
MPV: 9.6 fL (ref 8.7–12.5)
NEUTROPHIL #: 4.16 10*3/uL (ref 1.50–7.70)
NEUTROPHIL %: 43 %
PLATELETS: 270 10*3/uL (ref 150–400)
RBC: 4.28 10*6/uL — ABNORMAL LOW (ref 4.50–6.10)
RDW-CV: 13.9 % (ref 11.5–15.5)
WBC: 9.9 10*3/uL (ref 3.7–11.0)

## 2020-02-02 LAB — TROPONIN-I: TROPONIN I: <0.02 ng/mL (ref 0.00–0.02)

## 2020-02-02 LAB — ETHANOL, SERUM/PLASMA: ETHANOL: 37 mg/dL — ABNORMAL HIGH (ref <=10)

## 2020-02-02 LAB — BLUE TOP TUBE

## 2020-02-02 LAB — LAVENDER TOP TUBE

## 2020-02-02 NOTE — Care Management Notes (Signed)
Met with patient at bedside. Patient reports that he drank two beers today and then his legs gave out while at the bar. Patient reports that he is not suicidal and denies any intent or plan. Patient reports that he is currently residing in a recovery house. He does not like it there and would like help getting into a new recovery house if possible. Patient reports that he was in rehab a month ago at [facility]. He reports that they wouldn't take him back and he didn't like it there anyway. Patient agreeable to medical work up and then deciding on what to do from there. Patient again would like to get into a new recovery house. SW asked if he spoke with staff at his current house to help him get into a new one but he stated that they wouldn't help him but he didn't ask. SW will await medical work up.  Muhamad Serano, SOCIAL WORKER  02/02/2020, 17:55

## 2020-02-02 NOTE — ED Triage Notes (Signed)
MULTIPLE FALLS RECENTLY, LOWER BACK PAIN, REPORTS DEPRESSION RECENTLY.

## 2020-02-02 NOTE — Care Management Notes (Addendum)
SWer met with the patient at bedside to follow-up on previous engagement. The patient denied SI/HI. SWer and the pt reviewed [facility] [employer]. Pt reported plans to return to his current 3/4 house at discharge. SWer encouraged the pt to review the information on [facility]. Pt affirmed with SWer.       [facility] via wheelchair Lucianne Lei to transport within an hr. Pt's nurse informed

## 2020-02-02 NOTE — ED Provider Notes (Signed)
[facility]    Name: Kevin Hodges  Age and Gender: 54 y.o. male  PCP: Koleen Nimrod, CRNP    Chief Complaint:  Patient presents with     Chief Complaint   Patient presents with    Weakness    Fall       HPI    Kevin Hodges, date of birth [birth year], is a 54 y.o. male who presents to the Emergency Department via Ambulance with a CC of fall. HPI provided by patient.      Patient says he went to a job interview today and was told he did not get the job, so he got upset and then went to the bar. He had two beers.  Walking home he says his legs felt weak and he fell to the ground.  He struck his head.  He has chronic headache.  He is not on any blood thinners or any antiplatelets.  He says he has been having intermittent leg weakness.  He lives at a halfway house.  He is sad about his job status and is concerned that he is going to hurt himself when he falls, but when excessively active he is suicidal or wants to harm or kill himself he says no.      ROS:  Constitutional: Reviewed and negative unless mentioned in HPI.   Skin: Reviewed and negative unless mentioned in HPI.   HENT: Reviewed and negative unless mentioned in HPI.   Eyes: Reviewed and negative unless mentioned in HPI.   Cardio: Reviewed and negative unless mentioned in HPI.   Respiratory: Reviewed and negative unless mentioned in HPI.   GI:  Reviewed and negative unless mentioned in HPI.   GU:  Reviewed and negative unless mentioned in HPI.   MSK: Reviewed and negative unless mentioned in HPI.   Neuro: Reviewed and negative unless mentioned in HPI.   Psychiatric: Reviewed and negative unless mentioned in HPI.     PE:   ED Triage Vitals [02/02/20 1735]   BP (Non-Invasive) 106/64   Heart Rate 71   Respiratory Rate 18   Temperature 36.5 C (97.7 F)   SpO2 96 %   Weight    Height      Physical Exam  Vitals and nursing note reviewed.   Constitutional:       General: He is not in acute distress.     Appearance: He is  well-developed.   HENT:      Head: Normocephalic and atraumatic.   Eyes:      Conjunctiva/sclera: Conjunctivae normal.      Pupils: Pupils are equal, round, and reactive to light.   Neck:      Trachea: No tracheal deviation.      Comments: +C spine TTP  Cardiovascular:      Rate and Rhythm: Normal rate and regular rhythm.      Heart sounds: Normal heart sounds.   Pulmonary:      Effort: Pulmonary effort is normal. No respiratory distress.      Breath sounds: Normal breath sounds. No wheezing or rales.   Chest:      Chest wall: No tenderness.   Abdominal:      Palpations: Abdomen is soft.      Tenderness: There is no abdominal tenderness. There is no guarding or rebound.   Musculoskeletal:         General: Tenderness present. No deformity. Normal range of motion.      Cervical  back: Normal range of motion and neck supple.      Comments: +L spine TTP   Skin:     General: Skin is warm and dry.      Capillary Refill: Capillary refill takes less than 2 seconds.      Coloration: Skin is pale.      Findings: No rash.   Neurological:      General: No focal deficit present.      Mental Status: He is alert and oriented to person, place, and time.      Cranial Nerves: No cranial nerve deficit.      Motor: No weakness.      Comments: Strength 5/5 bilateral upper and lower extremities.  He has normal sensation.  He can dorsi and plantar flex at the ankle, evert and invert the ankle.   Psychiatric:         Behavior: Behavior normal.           Past Medical History:  Diagnosis     Past Medical History:   Diagnosis Date    Hearing loss     Tinnitus        Past Surgical History:  History reviewed. No pertinent surgical history.    Family History:   Family History   Problem Relation Age of Onset    No Known Problems Mother     No Known Problems Father        Social History     Social History     Tobacco Use    Smoking status: Never Smoker    Smokeless tobacco: Current User   Substance Use Topics    Alcohol use: Yes    Drug  use: Not on file       Social History     Substance and Sexual Activity   Drug Use Not on file       Alexandria M Bakos, CRNP    Allergies   Allergen Reactions    Dilantin [Phenytoin] Mental Status Effect    Buspirone Swelling    Ibuprofen Nausea/ Vomiting       Diagnostics:    Labs:    Labs Reviewed   BASIC METABOLIC PANEL - Abnormal; Notable for the following components:       Result Value    SODIUM 134 (*)     GLUCOSE 104 (*)     All other components within normal limits    Narrative:     Estimated Glomerular Filtration Rate (eGFR) calculated using the CKD-EPI (2009) equation, intended for patients 3 years of age and older. If race and/or gender is not documented or "unknown," there will be no eGFR calculation.   ETHANOL, SERUM - Abnormal; Notable for the following components:    ETHANOL 37 (*)     All other components within normal limits   CBC WITH DIFF - Abnormal; Notable for the following components:    RBC 4.28 (*)     HGB 12.3 (*)     HCT 36.3 (*)     EOSINOPHIL # 1.77 (*)     All other components within normal limits   TROPONIN-I - Normal   CBC/DIFF    Narrative:     The following orders were created for panel order CBC/DIFF.  Procedure                               Abnormality  Status                     ---------                               -----------         ------                     CBC WITH ZOXW[[id number]]                Abnormal            Final result                 Please view results for these tests on the individual orders.   BLUE TOP TUBE     Labs reviewed and interpreted by me.    Radiology:    CT CERVICAL SPINE WO IV CONTRAST   Final Result by Edi, Radresults In (06/09 2017)   No evidence of acute intracranial abnormality.   No evidence of acute fracture or malalignment in the cervical spine.         Signed by Simeon Craft, MD      CT BRAIN WO IV CONTRAST   Final Result by Edi, Radresults In (06/09 2017)   No evidence of acute intracranial abnormality.   No evidence of acute  fracture or malalignment in the cervical spine.         Signed by Simeon Craft, MD      CT LUMBAR SPINE WO IV CONTRAST   Final Result by Edi, Radresults In (06/09 2053)   Mild degenerative change to the lumbar without fracture.                      Signed by Barney Drain, MD          EKG:  Reviewed, see below  Most Recent EKG This Encounter   ECG 12 LEAD    Collection Time: 02/02/20  6:53 PM   Result Value    Ventricular rate 68    Atrial Rate 68    PR Interval 202    QRS Duration 90    QT Interval 444    QTC Calculation 472    Calculated P Axis 35    Calculated R Axis -43    Calculated T Axis 62    Narrative    Normal sinus rhythm  first degree av block  nonspecific T wave changes  no previous  Confirmed by Margrett Kalb MD, Morganna Styles ([id number]) on 02/02/2020 7:00:18 PM         Orders:  Orders Placed This Encounter    CT BRAIN WO IV CONTRAST    CT CERVICAL SPINE WO IV CONTRAST    CT LUMBAR SPINE WO IV CONTRAST    CBC/DIFF    BASIC METABOLIC PANEL    ETHANOL, SERUM    TROPONIN-I    CBC WITH DIFF    EXTRA TUBES    BLUE TOP TUBE    RED TOP TUBE    LAVENDER TOP TUBE    ECG 12 LEAD    CANCELED: INSERT & MAINTAIN PERIPHERAL IV ACCESS       ED Course/MDM:   Differential diagnosis includes, but is not limited to, generalized deconditioning versus alcohol intoxication versus neuropathy.  Patient has no signs of compress the spinal cord pathology on exam.  Will obtain  CT head and neck as well as L-spine to rule out acute traumatic injury.    ED Course as of Feb 03 1235   Wed Feb 02, 2020   1951 ETHANOL, SERUM(!): 37 [MA]   1951 TROPONIN-I: <0.02 [MA]   2054 CTs negative. Discharge.    [MA]      ED Course User Index  [MA] Joni Fears, MD        Medications given during ED stay include:  Medications - No data to display      Clinical Impression:   Encounter Diagnosis   Name Primary?    Fall Yes       Disposition: Discharged          // Joni Fears, MD 02/03/2020 17:41  Department of Emergency Medicine  [facility]    Parts of this patients chart were completed in a retrospective fashion due to simultaneous direct patient care activities in the Emergency Department. This note was partially generated using MModal Fluency Direct System, and there may be some incorrect words, spellings, and punctuation that were not noted in proofreading the note prior to saving.

## 2020-03-24 ENCOUNTER — Encounter (HOSPITAL_COMMUNITY): Payer: Self-pay

## 2020-03-24 ENCOUNTER — Emergency Department (HOSPITAL_COMMUNITY): Payer: Medicaid Other

## 2020-03-24 ENCOUNTER — Emergency Department
Admission: EM | Admit: 2020-03-24 | Discharge: 2020-03-25 | Disposition: A | Discharge status: Home / Self Care | Payer: Medicaid Other | Attending: Emergency Medicine | Admitting: Emergency Medicine

## 2020-03-24 ENCOUNTER — Other Ambulatory Visit: Payer: Self-pay

## 2020-03-24 DIAGNOSIS — W01198A Fall on same level from slipping, tripping and stumbling with subsequent striking against other object, initial encounter: Secondary | ICD-10-CM | POA: Insufficient documentation

## 2020-03-24 DIAGNOSIS — M542 Cervicalgia: Secondary | ICD-10-CM | POA: Insufficient documentation

## 2020-03-24 DIAGNOSIS — S0990XA Unspecified injury of head, initial encounter: Secondary | ICD-10-CM | POA: Insufficient documentation

## 2020-03-24 DIAGNOSIS — Z886 Allergy status to analgesic agent status: Secondary | ICD-10-CM

## 2020-03-24 DIAGNOSIS — W19XXXA Unspecified fall, initial encounter: Secondary | ICD-10-CM

## 2020-03-24 DIAGNOSIS — W010XXA Fall on same level from slipping, tripping and stumbling without subsequent striking against object, initial encounter: Secondary | ICD-10-CM

## 2020-03-24 DIAGNOSIS — M545 Low back pain: Secondary | ICD-10-CM | POA: Insufficient documentation

## 2020-03-24 DIAGNOSIS — Y903 Blood alcohol level of 60-79 mg/100 ml: Secondary | ICD-10-CM | POA: Insufficient documentation

## 2020-03-24 DIAGNOSIS — F17220 Nicotine dependence, chewing tobacco, uncomplicated: Secondary | ICD-10-CM | POA: Insufficient documentation

## 2020-03-24 DIAGNOSIS — Z20822 Contact with and (suspected) exposure to covid-19: Secondary | ICD-10-CM | POA: Insufficient documentation

## 2020-03-24 DIAGNOSIS — Y9301 Activity, walking, marching and hiking: Secondary | ICD-10-CM

## 2020-03-24 DIAGNOSIS — F1020 Alcohol dependence, uncomplicated: Secondary | ICD-10-CM | POA: Insufficient documentation

## 2020-03-24 DIAGNOSIS — F10288 Alcohol dependence with other alcohol-induced disorder: Secondary | ICD-10-CM

## 2020-03-24 DIAGNOSIS — F1010 Alcohol abuse, uncomplicated: Secondary | ICD-10-CM

## 2020-03-24 LAB — URINE DRUG SCREEN (UTN)
AMPHET QL: NEGATIVE
BARB QL: NEGATIVE
BENZO QL: NEGATIVE
BUP QL: NEGATIVE
CANNAQL: NEGATIVE
COCQL: NEGATIVE
METHQL: NEGATIVE
OPIATE: NEGATIVE
PCP QL: NEGATIVE

## 2020-03-24 LAB — URINALYSIS, MACRO/MICRO
BILIRUBIN: NEGATIVE mg/dL
BLOOD: NEGATIVE mg/dL
GLUCOSE: NEGATIVE mg/dL
KETONES: NEGATIVE mg/dL
LEUKOCYTES: NEGATIVE WBCs/uL
NITRITE: NEGATIVE
PH: 6 (ref 5.0–9.0)
PROTEIN: NEGATIVE mg/dL
SPECIFIC GRAVITY: 1.005 (ref 1.001–1.030)
UROBILINOGEN: 0.2 mg/dL (ref 0.2–1.0)

## 2020-03-24 MED ORDER — LORAZEPAM 1 MG TABLET
1.0000 mg | ORAL_TABLET | ORAL | Status: DC | PRN
Start: 2020-03-24 — End: 2020-03-25

## 2020-03-24 NOTE — ED Nurses Note (Signed)
Lab down to draw blood.

## 2020-03-24 NOTE — ED Provider Notes (Signed)
[facility]    Name: Kevin Hodges  Age and Gender: 54 y.o. male  PCP: Koleen Nimrod, CRNP    Chief Complaint:  Patient presents with     Chief Complaint   Patient presents with   . Fall       HPI    Kevin FELMLEE, date of birth 12-Feb-1966, is a 54 y.o. male who presents to the Emergency Department via Ambulance with a CC of alcohol dependence. HPI provided by patient.      Patient says he normally drinks about 2 beers a day.  He has been in alcohol treatment before.  Today he drank about 6-10 beers.  He was then walking home and tripped.  He hit his head on a building. No LOC.    He is c/o neck pain, lower back pain.   Used to be on anticoagulant, is not any longer.  He says he could not afford it.  He is not sure why he was anticoagulated or the name of the medication.   He denies a history of complicated withdrawals.  He says he has had a seizure before when he was taken off Klonopin abruptly.  Denies any ICU stays for alcohol withdrawal.  Denies SI or HI.  Denies any other substance use.      ROS:  Constitutional: Reviewed and negative unless mentioned in HPI.   Skin: Reviewed and negative unless mentioned in HPI.   HENT: Reviewed and negative unless mentioned in HPI.   Cardio: Reviewed and negative unless mentioned in HPI.   Respiratory: Reviewed and negative unless mentioned in HPI.   GI:  Reviewed and negative unless mentioned in HPI.   MSK: Reviewed and negative unless mentioned in HPI.   Neuro: Reviewed and negative unless mentioned in HPI.   Psychiatric: Reviewed and negative unless mentioned in HPI.     PE:   ED Triage Vitals   BP (Non-Invasive) 03/24/20 2215 (!) 164/86   Heart Rate 03/24/20 2216 96   Respiratory Rate 03/24/20 2216 14   Temperature 03/24/20 2216 36.3 C (97.3 F)   SpO2 03/24/20 2215 95 %   Weight 03/24/20 2216 108 kg (238 lb 15.7 oz)   Height --      Physical Exam  Vitals and nursing note reviewed.   Constitutional:       General: He is not in acute  distress.     Appearance: He is well-developed.   HENT:      Head: Normocephalic and atraumatic.   Eyes:      Extraocular Movements: Extraocular movements intact.      Conjunctiva/sclera: Conjunctivae normal.   Neck:      Trachea: No tracheal deviation.      Comments: Midline C spine TTP, C collar placed.   Cardiovascular:      Rate and Rhythm: Normal rate and regular rhythm.      Pulses: Normal pulses.      Heart sounds: Normal heart sounds.   Pulmonary:      Effort: Pulmonary effort is normal. No respiratory distress.      Breath sounds: Normal breath sounds. No wheezing or rales.   Abdominal:      Palpations: Abdomen is soft.      Tenderness: There is no abdominal tenderness. There is no guarding or rebound.   Musculoskeletal:         General: No deformity.      Cervical back: Tenderness present.  Comments: +midline L spine TTP   Skin:     General: Skin is warm and dry.      Findings: No rash.   Neurological:      Mental Status: He is alert and oriented to person, place, and time.   Psychiatric:         Behavior: Behavior normal.           Past Medical History:  Diagnosis     Past Medical History:   Diagnosis Date   . Hearing loss    . Tinnitus        Past Surgical History:  History reviewed. No pertinent surgical history.    Family History:   Family History   Problem Relation Age of Onset   . No Known Problems Mother    . No Known Problems Father        Social History     Social History     Tobacco Use   . Smoking status: Never Smoker   . Smokeless tobacco: Current User   Substance Use Topics   . Alcohol use: Yes   . Drug use: Not on file       Social History     Substance and Sexual Activity   Drug Use Not on file       Alexandria M Bakos, CRNP    Allergies   Allergen Reactions   . Dilantin [Phenytoin] Mental Status Effect   . Buspirone Swelling   . Ibuprofen Nausea/ Vomiting       Diagnostics:    Labs:    Labs Reviewed   COMPREHENSIVE METABOLIC PANEL, NON-FASTING - Abnormal; Notable for the following  components:       Result Value    BUN 5 (*)     All other components within normal limits    Narrative:     Estimated Glomerular Filtration Rate (eGFR) calculated using the CKD-EPI (2009) equation, intended for patients 69 years of age and older. If race and/or gender is not documented or "unknown," there will be no eGFR calculation.   ETHANOL, SERUM - Abnormal; Notable for the following components:    ETHANOL 115 (*)     All other components within normal limits   CBC WITH DIFF - Abnormal; Notable for the following components:    WBC 11.9 (*)     RDW-CV 15.6 (*)     NEUTROPHIL # 8.27 (*)     IMMATURE GRANULOCYTE # 0.11 (*)     All other components within normal limits   ETHANOL, SERUM - Abnormal; Notable for the following components:    ETHANOL 69 (*)     All other components within normal limits   URINE DRUG SCREEN (UTN) - Normal   THYROID STIMULATING HORMONE (SENSITIVE TSH) - Normal   URINALYSIS, MACRO/MICRO - Normal   RESPIRATORY VIRUS PANEL   CBC/DIFF    Narrative:     The following orders were created for panel order CBC/DIFF.  Procedure                               Abnormality         Status                     ---------                               -----------         ------  CBC WITH GXQJ[[id number]]                Abnormal            Final result                 Please view results for these tests on the individual orders.   URINALYSIS WITH MICROSCOPIC REFLEX IF INDICATED    Narrative:     The following orders were created for panel order URINALYSIS WITH MICROSCOPIC REFLEX IF INDICATED.  Procedure                               Abnormality         Status                     ---------                               -----------         ------                     URINALYSIS, MACRO/MICRO[[id number]]      Normal              Final result                 Please view results for these tests on the individual orders.         Radiology:    CT BRAIN WO IV CONTRAST   Final Result by Edi, Radresults  In (07/31 0159)   No CT evidence of acute intracranial abnormality.      No acute fracture or subluxation of cervical spine.         Signed by Anselm Pancoast, DO      CT CERVICAL SPINE WO IV CONTRAST   Final Result by Edi, Radresults In (07/31 0159)   No CT evidence of acute intracranial abnormality.      No acute fracture or subluxation of cervical spine.         Signed by Anselm Pancoast, DO      CT LUMBAR SPINE WO IV CONTRAST   Final Result by Edi, Radresults In (07/31 0208)   No acute osseous abnormality.  No significant change compared to CT lumbar spine 07/08/2019.   Ancillary findings as discussed.         Signed by Anselm Pancoast, DO          Orders:  Orders Placed This Encounter   . COVID-19 - SCREENING - Placement (NON-PUI)   . CT BRAIN WO IV CONTRAST   . CT CERVICAL SPINE WO IV CONTRAST   . CT LUMBAR SPINE WO IV CONTRAST   . CBC/DIFF   . COMPREHENSIVE METABOLIC PANEL, NON-FASTING   . ETHANOL, SERUM   . URINE DRUG SCREEN (UTN)   . URINALYSIS WITH MICROSCOPIC REFLEX IF INDICATED   . THYROID STIMULATING HORMONE (SENSITIVE TSH)   . CBC WITH DIFF   . URINALYSIS, MACRO/MICRO   . ETHANOL, SERUM   . INSERT & MAINTAIN PERIPHERAL IV ACCESS   . LORazepam (ATIVAN) tablet           ED Course/MDM:   Differential diagnosis includes, but is not limited to, alcohol intoxication with acute traumatic injury.   Appropriate labs and/or imaging was ordered and reviewed by me when resulted.  ED Course as of Mar 25 499   Sat Mar 25, 2020   0030 ETHANOL, SERUM(!): 115 [MA]   0213 CT L spine IMPRESSION:  No acute osseous abnormality.  No significant change compared to CT lumbar spine 07/08/2019.  Ancillary findings as discussed.    [MA]   5366 CT brain and C spine WNL    [MA]   0214 Awaiting SW placement.    [MA]   0455 ETHANOL, SERUM(!): 69 [MA]   4403 Pt cleared for treatment. Care transferred to oncoming doc at the end of my shift awaiting placement.    [MA]      ED Course User Index  [MA] Joni Fears, MD         Medications given during ED stay include:  Medications   LORazepam (ATIVAN) tablet (has no administration in time range)       Clinical Impression:   Encounter Diagnoses   Name Primary?   . Fall Yes   . Alcohol dependence (CMS [facility])        Disposition: [place]      // Joni Fears, MD 03/25/2020 22:13  Department of Emergency Medicine  [facility]    Parts of this patients chart were completed in a retrospective fashion due to simultaneous direct patient care activities in the Emergency Department. This note was partially generated using MModal Fluency Direct System, and there may be some incorrect words, spellings, and punctuation that were not noted in proofreading the note prior to saving.

## 2020-03-24 NOTE — ED Nurses Note (Signed)
Patient reports that he drinks several beers every day and tonight he went to bar and drank several beers and is requesting rehab. He reports talk to a rehab center earlier in week to try to get into rehab but has been unable to get in.

## 2020-03-25 ENCOUNTER — Emergency Department (EMERGENCY_DEPARTMENT_HOSPITAL)
Admission: EM | Admit: 2020-03-25 | Discharge: 2020-03-26 | Disposition: A | Discharge status: Other Acute Inpatient Hospital | Payer: Medicaid Other | Source: Home / Self Care | Attending: Medical | Admitting: Medical

## 2020-03-25 DIAGNOSIS — Z590 Homelessness: Secondary | ICD-10-CM | POA: Insufficient documentation

## 2020-03-25 DIAGNOSIS — R079 Chest pain, unspecified: Secondary | ICD-10-CM | POA: Diagnosis present

## 2020-03-25 DIAGNOSIS — F10129 Alcohol abuse with intoxication, unspecified: Secondary | ICD-10-CM | POA: Insufficient documentation

## 2020-03-25 DIAGNOSIS — Z720 Tobacco use: Secondary | ICD-10-CM | POA: Insufficient documentation

## 2020-03-25 DIAGNOSIS — Y906 Blood alcohol level of 120-199 mg/100 ml: Secondary | ICD-10-CM | POA: Insufficient documentation

## 2020-03-25 DIAGNOSIS — F10920 Alcohol use, unspecified with intoxication, uncomplicated: Secondary | ICD-10-CM

## 2020-03-25 DIAGNOSIS — Z886 Allergy status to analgesic agent status: Secondary | ICD-10-CM

## 2020-03-25 LAB — COMPREHENSIVE METABOLIC PANEL, NON-FASTING
ALBUMIN/GLOBULIN RATIO: 1.3 (ref >=1.0)
ALBUMIN/GLOBULIN RATIO: 1.3 (ref >=1.0)
ALBUMIN: 4.5 g/dL (ref 3.5–5.2)
ALBUMIN: 4.8 g/dL (ref 3.5–5.2)
ALKALINE PHOSPHATASE: 103 U/L (ref 41–133)
ALKALINE PHOSPHATASE: 95 U/L (ref 41–133)
ALT (SGPT): 8 U/L (ref 0–55)
ALT (SGPT): 9 U/L (ref 0–55)
ANION GAP: 13 mmol/L (ref 6–15)
ANION GAP: 15 mmol/L (ref 6–15)
AST (SGOT): 12 U/L (ref 5–34)
AST (SGOT): 17 U/L (ref 5–34)
BILIRUBIN TOTAL: 0.4 mg/dL (ref 0.2–1.2)
BILIRUBIN TOTAL: 0.6 mg/dL (ref 0.2–1.2)
BUN: 5 mg/dL — ABNORMAL LOW (ref 7–21)
BUN: 5 mg/dL — ABNORMAL LOW (ref 7–21)
CALCIUM: 9.1 mg/dL (ref 8.0–10.6)
CALCIUM: 9.6 mg/dL (ref 8.0–10.6)
CHLORIDE: 101 mmol/L (ref 98–107)
CHLORIDE: 97 mmol/L — ABNORMAL LOW (ref 98–107)
CO2 TOTAL: 20 mmol/L — ABNORMAL LOW (ref 21–32)
CO2 TOTAL: 22 mmol/L (ref 21–32)
CREATININE: 0.93 mg/dL (ref 0.80–1.60)
CREATININE: 1.07 mg/dL (ref 0.80–1.60)
ESTIMATED GFR: >60 mL/min/{1.73_m2}
ESTIMATED GFR: >60 mL/min/{1.73_m2}
GLUCOSE: 84 mg/dL (ref 70–100)
GLUCOSE: 99 mg/dL (ref 70–100)
POTASSIUM: 3.4 mmol/L (ref 3.3–5.1)
POTASSIUM: 3.9 mmol/L (ref 3.3–5.1)
PROTEIN TOTAL: 8 g/dL (ref 6.4–8.3)
PROTEIN TOTAL: 8.5 g/dL — ABNORMAL HIGH (ref 6.4–8.3)
SODIUM: 132 mmol/L — ABNORMAL LOW (ref 136–146)
SODIUM: 136 mmol/L (ref 136–146)

## 2020-03-25 LAB — CBC
HCT: 40.6 % (ref 38.9–52.0)
HGB: 13.6 g/dL (ref 13.4–17.5)
MCH: 28.8 pg (ref 26.0–32.0)
MCHC: 33.5 g/dL (ref 31.0–35.5)
MCV: 86 fL (ref 78.0–100.0)
MPV: 9.2 fL (ref 8.7–12.5)
PLATELETS: 363 10*3/uL (ref 150–400)
RBC: 4.72 10*6/uL (ref 4.50–6.10)
RDW-CV: 15.6 % — ABNORMAL HIGH (ref 11.5–15.5)
WBC: 13.5 10*3/uL — ABNORMAL HIGH (ref 3.7–11.0)

## 2020-03-25 LAB — CBC WITH DIFF
BASOPHIL #: 0.1 10*3/uL (ref <=0.20)
BASOPHIL %: 1 %
EOSINOPHIL #: 0.31 10*3/uL (ref <=0.50)
EOSINOPHIL %: 3 %
HCT: 46.3 % (ref 38.9–52.0)
HGB: 15 g/dL (ref 13.4–17.5)
IMMATURE GRANULOCYTE #: 0.11 10*3/uL — ABNORMAL HIGH (ref <0.10)
IMMATURE GRANULOCYTE %: 1 % (ref 0–1)
LYMPHOCYTE #: 2.47 10*3/uL (ref 1.00–4.80)
LYMPHOCYTE %: 21 %
MCH: 28.6 pg (ref 26.0–32.0)
MCHC: 32.4 g/dL (ref 31.0–35.5)
MCV: 88.4 fL (ref 78.0–100.0)
MONOCYTE #: 0.65 10*3/uL (ref 0.20–1.10)
MONOCYTE %: 6 %
MPV: 9.4 fL (ref 8.7–12.5)
NEUTROPHIL #: 8.27 10*3/uL — ABNORMAL HIGH (ref 1.50–7.70)
NEUTROPHIL %: 68 %
PLATELETS: 398 10*3/uL (ref 150–400)
RBC: 5.24 10*6/uL (ref 4.50–6.10)
RDW-CV: 15.6 % — ABNORMAL HIGH (ref 11.5–15.5)
WBC: 11.9 10*3/uL — ABNORMAL HIGH (ref 3.7–11.0)

## 2020-03-25 LAB — URINALYSIS, MACRO/MICRO
BILIRUBIN: NEGATIVE mg/dL
BLOOD: NEGATIVE mg/dL
GLUCOSE: NEGATIVE mg/dL
KETONES: NEGATIVE mg/dL
LEUKOCYTES: NEGATIVE WBCs/uL
NITRITE: NEGATIVE
PH: 5.5 (ref 5.0–9.0)
PROTEIN: NEGATIVE mg/dL
SPECIFIC GRAVITY: 1.007 (ref 1.001–1.030)
UROBILINOGEN: 0.2 mg/dL (ref 0.2–1.0)

## 2020-03-25 LAB — RESPIRATORY VIRUS PANEL
ADENOVIRUS ARRAY: NOT DETECTED
BORDETELLA PERTUSSIS ARRAY: NOT DETECTED
CHLAMYDOPHILA PNEUMONIAE ARRAY: NOT DETECTED
CORONAVIRUS 229E: NOT DETECTED
CORONAVIRUS HKU1: NOT DETECTED
CORONAVIRUS NL63: NOT DETECTED
CORONAVIRUS OC43: NOT DETECTED
INFLUENZA A (NO SUBTYPE DETECTED): NOT DETECTED
INFLUENZA A H1 2009: NOT DETECTED
INFLUENZA A H1: NOT DETECTED
INFLUENZA A H3: NOT DETECTED
INFLUENZA B ARRAY: NOT DETECTED
METAPNEUMOVIRUS ARRAY: NOT DETECTED
MYCOPLASMA PNEUMONIAE ARRAY: NOT DETECTED
PARAINFLUENZA 1 ARRAY: NOT DETECTED
PARAINFLUENZA 2 ARRAY: NOT DETECTED
PARAINFLUENZA 3 ARRAY: NOT DETECTED
PARAINFLUENZA 4 ARRAY: NOT DETECTED
RHINOVIRUS/ENTEROVIRUS ARRAY: NOT DETECTED
RSV ARRAY: NOT DETECTED
SARS CORONAVIRUS 2 (SARS-CoV-2): NOT DETECTED

## 2020-03-25 LAB — TRICYCLIC SCREEN: TCAQUAL: NEGATIVE

## 2020-03-25 LAB — ETHANOL, SERUM
ETHANOL: 115 mg/dL — ABNORMAL HIGH (ref <=10)
ETHANOL: 184 mg/dL — ABNORMAL HIGH (ref <=10)
ETHANOL: 69 mg/dL — ABNORMAL HIGH (ref <=10)

## 2020-03-25 LAB — THYROID STIMULATING HORMONE (SENSITIVE TSH)
TSH: 2.198 u[IU]/mL (ref 0.200–5.000)
TSH: 2.301 u[IU]/mL (ref 0.200–5.000)

## 2020-03-25 LAB — URINE DRUG SCREEN (UTN)
AMPHET QL: NEGATIVE
BARB QL: NEGATIVE
BENZO QL: NEGATIVE
BUP QL: NEGATIVE
CANNAQL: NEGATIVE
COCQL: NEGATIVE
METHQL: NEGATIVE
OPIATE: NEGATIVE
PCP QL: NEGATIVE

## 2020-03-25 LAB — SALICYLATE ACID LEVEL: SALICYLATE LEVEL: <5 mg/dL (ref <=30)

## 2020-03-25 LAB — MAGNESIUM: MAGNESIUM: 2 mg/dL (ref 1.5–2.5)

## 2020-03-25 LAB — AMMONIA: AMMONIA: 22 umol/L (ref 16–53)

## 2020-03-25 LAB — ACETAMINOPHEN LEVEL: ACETAMINOPHEN LEVEL: <10 ug/mL (ref <30)

## 2020-03-25 NOTE — Care Plan (Signed)
SW spoke with [facility], stated she need to talk to staff about referral, will call SW back.     [address] [place], SOCIAL WORKER  03/25/2020, 12:40

## 2020-03-25 NOTE — ED Provider Notes (Signed)
[facility]       Attending Physician: Theone Murdoch. Delman Kitten  CC:  Chief Complaint   Patient presents with    Alcohol intoxication     HPI:  Kevin Hodges is a 54 y.o. male who presents to the ED via POV with c/o alcohol intoxication.  He staes he drank 4 [employer] beers after he was discharged from here this afternoon.  He was to go to the shelter because he is homeless but decided to go to [place] and get beer. He states he doesn't want to drink but he has nowhere to go because he's been thrown out of his room for drinking and he's been thrown out of rehab's because he doesn't participate in group therapy.  He wants to quit he states he drinks atleast 2 beers on a daily basis.  He was seen here yesterday for the same complaint with an alcohol of 115.    Review of Systems:  Constitutional: No fever or chills  Skin: No rash or wound  HENT: No headaches or congestion  Eyes: No vision changes or discharge  Cardio: No chest pain or edema  Respiratory: No cough or dyspnea  GI:  No abdominal pain, nausea, vomiting or stool changes  GU:  No urinary changes  MSK: No joint or back pain  Neuro:No numbness, tingling or weakness.   All other systems reviewed and are negative or as noted in HPI.    History:  PMH:    Past Medical History:   Diagnosis Date    Hearing loss     Tinnitus          Previous Medications    ALBUTEROL SULFATE (PROVENTIL OR VENTOLIN OR PROAIR) 90 MCG/ACTUATION INHALATION HFA AEROSOL INHALER        DIVALPROEX (DEPAKOTE) 250 MG ORAL TABLET, DELAYED RELEASE (E.C.)        DULOXETINE (CYMBALTA DR) 30 MG ORAL CAPSULE, DELAYED RELEASE(E.C.)        FOLIC ACID (FOLVITE) 1 MG ORAL TABLET        GABAPENTIN (NEURONTIN) 300 MG ORAL CAPSULE        HYDROXYZINE HCL (ATARAX) 50 MG ORAL TABLET        MECLIZINE (ANTIVERT) 12.5 MG ORAL TABLET        OMEPRAZOLE (PRILOSEC) 20 MG ORAL CAPSULE, DELAYED RELEASE(E.C.)        ONDANSETRON (ZOFRAN) 4 MG ORAL TABLET        PANTOPRAZOLE (PROTONIX) 40 MG ORAL  TABLET, DELAYED RELEASE (E.C.)        RISPERIDONE (RISPERDAL) 3 MG ORAL TABLET        TRAZODONE (DESYREL) 100 MG ORAL TABLET           PSH:        Social Hx:    Social History     Socioeconomic History    Marital status: Single     Spouse name: Not on file    Number of children: Not on file    Years of education: Not on file    Highest education level: Not on file   Occupational History    Not on file   Tobacco Use    Smoking status: Never Smoker    Smokeless tobacco: Current User   Substance and Sexual Activity    Alcohol use: Yes    Drug use: Not on file    Sexual activity: Not on file   Other Topics Concern    Not on file  Social History Narrative    Not on file     Social Determinants of Health     Financial Resource Strain:     Difficulty of Paying Living Expenses:    Food Insecurity:     Worried About Charity fundraiser in the Last Year:     Arboriculturist in the Last Year:    Transportation Needs:     Film/video editor (Medical):     Lack of Transportation (Non-Medical):    Physical Activity:     Days of Exercise per Week:     Minutes of Exercise per Session:    Stress:     Feeling of Stress :    Intimate Partner Violence:     Fear of Current or Ex-Partner:     Emotionally Abused:     Physically Abused:     Sexually Abused:      Family Hx:   Family History   Problem Relation Age of Onset    No Known Problems Mother     No Known Problems Father      Allergies:   Allergies   Allergen Reactions    Dilantin [Phenytoin] Mental Status Effect    Buspirone Swelling    Ibuprofen Nausea/ Vomiting       Above history reviewed with patient, changes are as documented.    Physical Exam:   Nursing note and vitals reviewed.  ED Triage Vitals   BP    Pulse    Resp    Temp    SpO2    Weight    Height        Constitutional: Pt is alert and oriented and appears well-developed and well-nourished. No acute distress.   HENT:   Mouth/Throat: Oropharynx is clear and moist.   Eyes: Conjunctivae and  extraocular motions are normal. Pupils are equal, round, and reactive to light.   Neck: Normal range of motion. Neck supple.   Cardiovascular: Normal rate, regular rhythm and intact distal pulses.    Pulmonary/Chest: Effort normal. No respiratory distress. Breath sounds normal  Abdominal: Abdomen is soft, nontender, nondistended.  No rebound or guarding noted.   Musculoskeletal: Normal range of motion.   Neurological: Pt is alert and oriented. Grossly intact. Moving all extremities. Facies symmetric.   Skin: Skin is warm and dry without rash.  Color normal.      Course:   Impression: Pt presenting c/o   Chief Complaint   Patient presents with    Alcohol intoxication       Plan:Will obtain the following labs/imaging and give patient the following medications to alleviate symptoms:  Orders Placed This Encounter    COMPREHENSIVE METABOLIC PANEL, NON-FASTING    ETHANOL, SERUM    AMMONIA    CANCELED: BASIC METABOLIC PANEL    CBC    ACETAMINOPHEN LEVEL    CANCELED: ETHANOL, SERUM    SALICYLATE ACID LEVEL    TRICYCLIC SCREEN    MAGNESIUM    THYROID STIMULATING HORMONE (SENSITIVE TSH)    URINE DRUG SCREEN (UTN)    URINALYSIS WITH REFLEX MICROSCOPIC AND CULTURE IF POSITIVE    URINALYSIS, MACRO/MICRO    EXTRA TUBES    LIGHT GREEN TOP TUBE    GRAY TOP TUBE    ETHANOL, SERUM    CANCELED: UTN ADMISSION BED REQUEST - ED USE ONLY     Laboratory Results:  Labs Reviewed   COMPREHENSIVE METABOLIC PANEL, NON-FASTING - Abnormal; Notable for the following components:  Result Value    SODIUM 132 (*)     CHLORIDE 97 (*)     CO2 TOTAL 20 (*)     BUN 5 (*)     PROTEIN TOTAL 8.5 (*)     All other components within normal limits    Narrative:     Estimated Glomerular Filtration Rate (eGFR) calculated using the CKD-EPI (2009) equation, intended for patients 80 years of age and older. If race and/or gender is not documented or "unknown," there will be no eGFR calculation.   ETHANOL, SERUM - Abnormal; Notable for the  following components:    ETHANOL 184 (*)     All other components within normal limits   CBC - Abnormal; Notable for the following components:    WBC 13.5 (*)     RDW-CV 15.6 (*)     All other components within normal limits   ETHANOL, SERUM - Abnormal; Notable for the following components:    ETHANOL 51 (*)     All other components within normal limits   AMMONIA - Normal   ACETAMINOPHEN LEVEL - Normal   SALICYLATE ACID LEVEL - Normal   TRICYCLIC SCREEN - Normal   MAGNESIUM - Normal   THYROID STIMULATING HORMONE (SENSITIVE TSH) - Normal   URINE DRUG SCREEN (UTN) - Normal   URINALYSIS, MACRO/MICRO - Normal   URINALYSIS WITH REFLEX MICROSCOPIC AND CULTURE IF POSITIVE    Narrative:     The following orders were created for panel order URINALYSIS WITH REFLEX MICROSCOPIC AND CULTURE IF POSITIVE.  Procedure                               Abnormality         Status                     ---------                               -----------         ------                     URINALYSIS, MACRO/MICRO[[id number]]      Normal              Final result                 Please view results for these tests on the individual orders.   EXTRA TUBES    Narrative:     The following orders were created for panel order EXTRA TUBES.  Procedure                               Abnormality         Status                     ---------                               -----------         ------                     LIGHT GREEN TOP ZOXW[[id number]]  Final result               GRAY TOP [id number]                                    Final result                 Please view results for these tests on the individual orders.   LIGHT GREEN TOP TUBE   GRAY TOP TUBE         Radiographical Imaging:        EKG- No results found for this visit on 03/25/20 (from the past 720 hour(s)).      All labs were reviewed. Medical Records reviewed.     MDM/Course:    Our SW [place] spoke with the homeless shelter and he can go there tomorrow after  1700 but he cannot be intoxicated.  The patient has been up and down to the bathroom several times and has not caused any problems  Visit Vitals  There were no vitals filed for this visit.           Consults: Social Service      Disposition: Discharged    Following the above history, physical exam, and studies, the patient was deemed stable and suitable for discharge. Discharge and medication instructions were discussed with the patient/patient's family and all questions were addressed. The patient understands that they may return to the ED at any time for new or worsening symptoms, or if they have any other concerns.      Clinical Impression:   Encounter Diagnosis   Name Primary?    Alcoholic intoxication without complication (CMS [place]) Yes     Follow Up: Koleen Nimrod, CRNP  [address]  [place] [address]  [phone]      The Social Worker provided you with tokens to get to the homeless shelter.  You need to call the rehab facilities provided to you for treatment.    Medications Prescribed:   New Prescriptions    No medications on file       The co-signing faculty was physically present in the emergency department and available for consultation and did not particpate in the care of this patient.    Prince Solian, [place] 03/26/2020, 06:04        // Joelene Millin B. Delman Kitten 03/26/2020 23:25  Department of Emergency Medicine [facility]

## 2020-03-25 NOTE — ED Triage Notes (Signed)
Patient discharged from here today, etoh

## 2020-03-25 NOTE — Care Plan (Signed)
SW met with patient at bedside, pt still interested in going to [facility]. Pt reports he has been drinking again since Tuesday.     SW called [facility], no answer, sw left message requesting return call.    Ruth, SOCIAL WORKER  03/25/2020, 08:02

## 2020-03-25 NOTE — Care Plan (Signed)
SW called [facility] to follow up regarding admission, no answer, left VM requesting return call    [facility], SOCIAL WORKER  03/25/2020, 12:13

## 2020-03-25 NOTE — ED Nurses Note (Signed)
ETOH level drawn from patient's right foot with buttery needle. Patient tolerated well.

## 2020-03-25 NOTE — Discharge Instructions (Signed)
Follow up outpatient for your alcohol usage. Return to ER as neeeded.

## 2020-03-25 NOTE — Care Management Notes (Signed)
Call from [facility] shelter in [place]. They report that the patient can come to their facility tomorrow anytime after 5 PM.  Alben Deeds, SOCIAL WORKER  03/25/2020, 22:15

## 2020-03-25 NOTE — Care Management Notes (Addendum)
Met with patient at bedside. Patient reports having relapsed and started drinking again. Patient reports that he drinks anywhere between 2-10 beers a day but "usually" only 2-3. Patient reports having successfully completed rehab at [facility] about 3 months ago but cannot give me an exact date of when he started drinking again after leaving rehab. Patient would like to go back to [facility]. He reports that he called them to see if they had beds and would like to go back. SW will check in AM for bed availability as patient's ETOH is still over the legal limit at this time. Will continue to follow.  Quin Mathenia, SOCIAL WORKER  03/25/2020, 02:22

## 2020-03-25 NOTE — Care Plan (Signed)
[facility] called and declined patient    They provided two additional rehabs for patient to try. SW provided pt with numbers and phone to call [facility] and [facility]    Leonardo, SOCIAL WORKER  03/25/2020, 13:23

## 2020-03-25 NOTE — ED Nurses Note (Signed)
Pt transferred from ED room 17 to ED SEC. Nuring report given to CN. Two IVs removed. Pt stable

## 2020-03-25 NOTE — ED Nurses Note (Signed)
PT RESTING IN BED, LUNCH TRAY DELIVERED AT THIS TIME. PT HAS NO COMPLAINTS. WILL CONT TO MONITOR.

## 2020-03-25 NOTE — Care Plan (Signed)
SW made attempt to reach Kevin Hodges to complete intake, no answer [phone]     SW left VM, awaiting return call

## 2020-03-25 NOTE — ED Provider Notes (Signed)
Patient was stable the whole time in the department here, Kevin Hodges, social work try to get him into rehab but they have no availability and basically he is ambulating around the department without any difficulty.  He has no SI or HI complaints seizure be discharged follow-up outpatient.  Diagnosis is fall and alcohol abuse

## 2020-03-25 NOTE — Care Plan (Addendum)
SW spoke with [facility], inpatient rehab for patient. They state they could review patient for admission on Monday. SW made patient aware, he stated he did not have a place to stay until then. SW offered to [facility] for [facility] until admission, left VM    SW provided pt with inpatient d&a list, homeless shelter list, and written directions to homeless shelter. Pt verbalized understanding

## 2020-03-25 NOTE — Care Plan (Signed)
Pt completed phone intake with [facility]. Pt informed SW that [facility] was calling his insurance to verify.    SW awaiting return call from [facility] for disposition

## 2020-03-25 NOTE — Care Plan (Signed)
SW contacted [facility] for bed availability spoke with Tiffany. Tiffany discussed she was gpoing to check with her supervisor and and follow up SW.    Tiffany from [facility] contacted SW and informed him that she needs to talk with the Pt to complete a triage

## 2020-03-26 LAB — ETHANOL, SERUM: ETHANOL: 51 mg/dL — ABNORMAL HIGH (ref <=10)

## 2020-03-26 LAB — LIGHT GREEN TOP TUBE

## 2020-03-26 LAB — GRAY TOP TUBE

## 2020-03-26 NOTE — Care Management Notes (Signed)
Patient wanted to speak with SW. Patient asking if we were able to find him somewhere to stay. Advised that none of the rehab facilities we contacted have beds available at least until Monday. Advised that he would need to call them first thing on Monday to locate a bed for himself but that he cannot stay here or just come back here for a place to sleep and stay. Advised that the homeless shelter in [place] has a bed for him any time after 5 PM tonight. He stated he would rather just got get drunk again before he went to the shelter or that he would just get drunk and come back here. Explained again that this facility is not somewhere to just come sleep because he's homeless, and that attitude would not help him with his goal of sobriety. He verbalized understanding and that he would go to the shelter. Will give him [employer] tokens for him to use to get to the shelter.  Esvin Hnat, SOCIAL WORKER  03/26/2020, 01:49
